# Patient Record
Sex: Male | Born: 1950
Health system: Southern US, Community
[De-identification: ages and names within clinical notes are randomized; demographics above are authoritative.]

## PROBLEM LIST (undated history)

## (undated) DIAGNOSIS — E119 Type 2 diabetes mellitus without complications: Secondary | ICD-10-CM

## (undated) DIAGNOSIS — I639 Cerebral infarction, unspecified: Secondary | ICD-10-CM

## (undated) DIAGNOSIS — E7800 Pure hypercholesterolemia, unspecified: Secondary | ICD-10-CM

## (undated) DIAGNOSIS — I10 Essential (primary) hypertension: Secondary | ICD-10-CM

---

## 2001-07-11 ENCOUNTER — Encounter: Payer: Self-pay | Admitting: Emergency Medicine

## 2001-07-11 ENCOUNTER — Inpatient Hospital Stay (HOSPITAL_COMMUNITY): Admission: EM | Admit: 2001-07-11 | Discharge: 2001-07-13 | Payer: Self-pay | Admitting: Emergency Medicine

## 2014-02-12 ENCOUNTER — Inpatient Hospital Stay (HOSPITAL_COMMUNITY): Payer: Medicare Other

## 2014-02-12 ENCOUNTER — Emergency Department (HOSPITAL_COMMUNITY): Payer: Medicare Other

## 2014-02-12 ENCOUNTER — Encounter (HOSPITAL_COMMUNITY): Payer: Self-pay | Admitting: Emergency Medicine

## 2014-02-12 ENCOUNTER — Inpatient Hospital Stay (HOSPITAL_COMMUNITY)
Admission: EM | Admit: 2014-02-12 | Discharge: 2014-02-13 | DRG: 066 | Disposition: A | Discharge status: Home / Self Care | Payer: Medicare Other | Attending: Internal Medicine | Admitting: Internal Medicine

## 2014-02-12 DIAGNOSIS — G459 Transient cerebral ischemic attack, unspecified: Secondary | ICD-10-CM | POA: Diagnosis present

## 2014-02-12 DIAGNOSIS — I10 Essential (primary) hypertension: Secondary | ICD-10-CM | POA: Diagnosis present

## 2014-02-12 DIAGNOSIS — I6350 Cerebral infarction due to unspecified occlusion or stenosis of unspecified cerebral artery: Principal | ICD-10-CM | POA: Diagnosis present

## 2014-02-12 DIAGNOSIS — R209 Unspecified disturbances of skin sensation: Secondary | ICD-10-CM | POA: Diagnosis not present

## 2014-02-12 DIAGNOSIS — Z79899 Other long term (current) drug therapy: Secondary | ICD-10-CM | POA: Diagnosis not present

## 2014-02-12 DIAGNOSIS — G458 Other transient cerebral ischemic attacks and related syndromes: Secondary | ICD-10-CM

## 2014-02-12 DIAGNOSIS — I639 Cerebral infarction, unspecified: Secondary | ICD-10-CM

## 2014-02-12 DIAGNOSIS — I6381 Other cerebral infarction due to occlusion or stenosis of small artery: Secondary | ICD-10-CM

## 2014-02-12 LAB — COMPREHENSIVE METABOLIC PANEL
ALBUMIN: 3.6 g/dL (ref 3.5–5.2)
ALT: 32 U/L (ref 0–53)
AST: 29 U/L (ref 0–37)
Alkaline Phosphatase: 94 U/L (ref 39–117)
Anion gap: 12 (ref 5–15)
BUN: 13 mg/dL (ref 6–23)
CO2: 25 mEq/L (ref 19–32)
Calcium: 9 mg/dL (ref 8.4–10.5)
Chloride: 101 mEq/L (ref 96–112)
Creatinine, Ser: 1.02 mg/dL (ref 0.50–1.35)
GFR calc non Af Amer: 75 mL/min — ABNORMAL LOW (ref >90)
GFR, EST AFRICAN AMERICAN: 87 mL/min — AB (ref >90)
GLUCOSE: 107 mg/dL — AB (ref 70–99)
POTASSIUM: 3.8 meq/L (ref 3.7–5.3)
Sodium: 138 mEq/L (ref 137–147)
TOTAL PROTEIN: 7.5 g/dL (ref 6.0–8.3)
Total Bilirubin: <0.2 mg/dL — ABNORMAL LOW (ref 0.3–1.2)

## 2014-02-12 LAB — CBC WITH DIFFERENTIAL/PLATELET
Basophils Absolute: 0 10*3/uL (ref 0.0–0.1)
Basophils Relative: 0 % (ref 0–1)
Eosinophils Absolute: 0.2 10*3/uL (ref 0.0–0.7)
Eosinophils Relative: 4 % (ref 0–5)
HCT: 40.8 % (ref 39.0–52.0)
Hemoglobin: 13.1 g/dL (ref 13.0–17.0)
Lymphocytes Relative: 37 % (ref 12–46)
Lymphs Abs: 2.2 10*3/uL (ref 0.7–4.0)
MCH: 23.2 pg — ABNORMAL LOW (ref 26.0–34.0)
MCHC: 32.1 g/dL (ref 30.0–36.0)
MCV: 72.2 fL — ABNORMAL LOW (ref 78.0–100.0)
Monocytes Absolute: 0.8 10*3/uL (ref 0.1–1.0)
Monocytes Relative: 13 % — ABNORMAL HIGH (ref 3–12)
Neutro Abs: 2.7 10*3/uL (ref 1.7–7.7)
Neutrophils Relative %: 46 % (ref 43–77)
Platelets: 179 10*3/uL (ref 150–400)
RBC: 5.65 MIL/uL (ref 4.22–5.81)
RDW: 15 % (ref 11.5–15.5)
WBC: 5.9 10*3/uL (ref 4.0–10.5)

## 2014-02-12 LAB — PROTIME-INR
INR: 1.03 (ref 0.00–1.49)
PROTHROMBIN TIME: 13.5 s (ref 11.6–15.2)

## 2014-02-12 LAB — I-STAT TROPONIN, ED: Troponin i, poc: 0 ng/mL (ref 0.00–0.08)

## 2014-02-12 LAB — APTT: APTT: 27 s (ref 24–37)

## 2014-02-12 MED ORDER — ASPIRIN 325 MG PO TABS
325.0000 mg | ORAL_TABLET | Freq: Every day | ORAL | Status: DC
  Administered 2014-02-12 – 2014-02-13 (×2): 325 mg via ORAL
  Filled 2014-02-12 (×2): qty 1

## 2014-02-12 MED ORDER — HEPARIN SODIUM (PORCINE) 5000 UNIT/ML IJ SOLN
5000.0000 [IU] | Freq: Three times a day (TID) | INTRAMUSCULAR | Status: DC
  Administered 2014-02-12 – 2014-02-13 (×4): 5000 [IU] via SUBCUTANEOUS
  Filled 2014-02-12 (×4): qty 1

## 2014-02-12 MED ORDER — ASPIRIN 300 MG RE SUPP: 300.0000 mg | Freq: Every day | RECTAL | Status: DC

## 2014-02-12 MED ORDER — STROKE: EARLY STAGES OF RECOVERY BOOK
Freq: Once | Status: DC
  Filled 2014-02-12: qty 1

## 2014-02-12 NOTE — Progress Notes (Signed)
STROKE TEAM PROGRESS NOTE   HISTORY Edward Morrow is a 63 y.o. male with a presentation of left-sided numbness that started around 1 AM. He states that he noticed that the left side of his face and his left arm both went numb, "like he had laid on it and it had gone to sleep." He states that it is getting better, and is almost resolved at this point. He does not have any medical problems, however he does not go to physicians. On EMS arrival, his blood pressure was in the 200s systolic, but after transporting him his symptoms began to resolve and his blood pressure has come down to 130s. He denies any known medical problems, he does state that his blood pressure is always higher when it is checked.    LKW: 1 AM  tpa given?: no, mild symptoms  NIH: 0    SUBJECTIVE (INTERVAL HISTORY) No family members present today. The patient still has mild left facial and left arm numbness. His strength is intact and 5 over 5 throughout. The results of his MRI were discussed with the patient and questions were answered. He admits he does not have a primary care physician. We encouraged him to become established with a physician following discharge. We discussed the importance of compliance with his medications.   OBJECTIVE Temp:  [97.6 F (36.4 C)-98.3 F (36.8 C)] 97.6 F (36.4 C) (09/20 0600) Pulse Rate:  [70-87] 70 (09/20 0600) Cardiac Rhythm:  [-]  Resp:  [10-18] 18 (09/20 0600) BP: (120-150)/(59-95) 150/81 mmHg (09/20 0600) SpO2:  [97 %-100 %] 100 % (09/20 0600) Weight:  [150 lb (68.04 kg)-162 lb 3.2 oz (73.573 kg)] 162 lb 3.2 oz (73.573 kg) (09/20 0600)  No results found for this basename: GLUCAP,  in the last 168 hours  Recent Labs Lab 02/12/14 0321  NA 138  K 3.8  CL 101  CO2 25  GLUCOSE 107*  BUN 13  CREATININE 1.02  CALCIUM 9.0    Recent Labs Lab 02/12/14 0321  AST 29  ALT 32  ALKPHOS 94  BILITOT <0.2*  PROT 7.5  ALBUMIN 3.6    Recent Labs Lab 02/12/14 0321  WBC  5.9  NEUTROABS 2.7  HGB 13.1  HCT 40.8  MCV 72.2*  PLT 179   No results found for this basename: CKTOTAL, CKMB, CKMBINDEX, TROPONINI,  in the last 168 hours  Recent Labs  02/12/14 0321  LABPROT 13.5  INR 1.03   No results found for this basename: COLORURINE, APPERANCEUR, LABSPEC, PHURINE, GLUCOSEU, HGBUR, BILIRUBINUR, KETONESUR, PROTEINUR, UROBILINOGEN, NITRITE, LEUKOCYTESUR,  in the last 72 hours  No results found for this basename: chol,  trig,  hdl,  cholhdl,  vldl,  ldlcalc   No results found for this basename: HGBA1C   No results found for this basename: labopia,  cocainscrnur,  labbenz,  amphetmu,  thcu,  labbarb    No results found for this basename: ETH,  in the last 168 hours  Ct Head Wo Contrast 02/12/2014    Unremarkable noncontrast CT of the head.     MRI / MRA 02/12/2014 Sub cm acute infarction affecting the lateral right thalamus.  Otherwise normal appearance of the brain. No sign of hemorrhage or mass effect.  Normal intracranial MR angiography of the large and medium size vessels.    PHYSICAL EXAM  Gen: NAD, conversant  CV: RRR, No Murmurs ABD: Soft, NT  Mental Status:  Patient is awake, alert, oriented to person, place, month, year, and situation.  Immediate and remote memory are intact.  Patient is able to give a clear and coherent history.  No signs of aphasia or neglect  Cranial Nerves:  II: Visual Fields are full. Pupils are equal, round, and reactive to light. Discs are difficult to visualize.  III,IV, VI: EOMI without ptosis or diploplia.  V: Facial sensation is symmetric to temperature  VII: Left nasolabial flattening.  VIII: hearing is intact to voice  X: Uvula elevates symmetrically  XI: Shoulder shrug is symmetric.  XII: tongue is midline without atrophy or fasciculations.  Motor:  Tone is normal. Bulk is normal. 5/5 strength was present in all four extremities. However mild left arm drift. Sensory:  Decreased sensation to light  touch in the left upper extremity Deep Tendon Reflexes:  2+ and symmetric in the biceps and patellae.  Plantars:  Toes are downgoing bilaterally.  Cerebellar:  FNF and HKS are intact bilaterally     ASSESSMENT/PLAN  Edward Morrow is a 63 y.o. male with no significant past medical history  presenting with numbness of his left arm and face.Marland Kitchen He did not receive IV t-PA as his symptoms were mild. CT of the head was unremarkable.    MRI -Sub cm acute infarction affecting the lateral right thalamus secondary to small vessel disease.  no antithrombotics prior to admission, now on aspirin 325 mg orally every day  MRI as above  MRA  Normal intracranial MR angiography of the large and medium size vessels.  Carotid Doppler  pending  2D Echo  pending   LDL pending  HgbA1c pending  Ongoing aggressive risk factor management  Risk factor education  Patient counseled to be compliant with his antithrombotic medications and follow up with primary care (despite not liking doctors)  Hypertension - no previous history of hypertension however BP was initially elevated.  Home meds: - No antihypertensive medications prior to admission  BP (120-150)/(59-95) 150/81 mmHg (09/20 0600) past 24h (02/12/2014 @ 3:40 PM)  SBP goal normotensive  Stable   Hyperlipidemia - no previous history of hyperlipidemia.  Home meds: - No lipid lowering medications prior to admission.  LDL pending   LDL goal < 100 (<70 for diabetics)  Diabetes - no previous history of diabetes  Home meds: - No diabetic medications prior to admission  HgbA1c - pending  Goal < 7.0  Educated patient about lifestyle changes for diabetes prevention  Other Stroke Risk Factors Advanced age  Hospital day # 0  Personally examined patient and images, agree with history, physical, neuro exam as stated above. Agree with assessment and plan.   Naomie Dean, MD [facility]  Delton See  PA-C [facility] Pager [phone] 02/12/2014, 3:40 PM     To contact Stroke Continuity provider, please refer to [email or web address]. After hours, contact General Neurology

## 2014-02-12 NOTE — ED Notes (Signed)
Patient transported to CT 

## 2014-02-12 NOTE — ED Notes (Signed)
Dr. Petra Kuba, neurologist, at bedside assessing patient

## 2014-02-12 NOTE — Consult Note (Signed)
Neurology Consultation Reason for Consult: Left-sided numbness Referring Physician: Judd Lien, D  CC: Left-sided numbness  History is obtained from: Patient  HPI: Edward Morrow is a 63 y.o. male with a presentation of left-sided numbness that started around 1 AM. He states that he noticed that the left side of his face and his left arm both went numb, "like he had laid on it and it had gone to sleep." He states that it is getting better, and is almost resolved at this point.  He does not have any medical problems, however he does not go to physicians. On EMS arrival, his blood pressure was in the 200s systolic, but after transporting him after his symptoms began to resolve his blood pressure has come down to 130s.  That he denies any known medical problems, he does state that his blood pressure is always higher when it is checked.  LKW: 1 AM tpa given?: no, mild symptoms NIH: 0   ROS: A 14 point ROS was performed and is negative except as noted in the HPI.   Past medical history:  Hypertension  Family History: Stroke  Social History: Tob: Chews  Exam: Current vital signs: BP 148/78  Pulse 82  Temp(Src) 97.9 F (36.6 C) (Oral)  Resp 15  Ht  (1.651 m)  Wt 68.04 kg (150 lb)  BMI 24.96 kg/m2  SpO2 98% Vital signs in last 24 hours: Temp:  [97.9 F (36.6 C)-98.3 F (36.8 C)] 97.9 F (36.6 C) (09/20 0330) Pulse Rate:  [82-87] 82 (09/20 0322) Resp:  [10-18] 15 (09/20 0322) BP: (144-148)/(78-89) 148/78 mmHg (09/20 0322) SpO2:  [98 %-99 %] 98 % (09/20 0322) Weight:  [68.04 kg (150 lb)] 68.04 kg (150 lb) (09/20 0237)  General: In bed, NAD CV: Regular rate and rhythm Mental Status: Patient is awake, alert, oriented to person, place, month, year, and situation. Immediate and remote memory are intact. Patient is able to give a clear and coherent history. No signs of aphasia or neglect Cranial Nerves: II: Visual Fields are full. Pupils are equal, round, and reactive to  light.  Discs are difficult to visualize. III,IV, VI: EOMI without ptosis or diploplia.  V: Facial sensation is symmetric to temperature VII: Facial movement is symmetric.  VIII: hearing is intact to voice X: Uvula elevates symmetrically XI: Shoulder shrug is symmetric. XII: tongue is midline without atrophy or fasciculations.  Motor: Tone is normal. Bulk is normal. 5/5 strength was present in all four extremities.  Sensory: Sensation is symmetric to pinprick. Deep Tendon Reflexes: 2+ and symmetric in the biceps and patellae.  Plantars: Toes are downgoing bilaterally.  Cerebellar: FNF and HKS are intact bilaterally Gait: Not assessed due to  multiple medical monitors in ED setting.   I have reviewed labs in epic and the results pertinent to this consultation are: CMP-unremarkable  I have reviewed the images obtained: CT head-unremarkable  Impression: 63 year old male with a history of hypertension who presents with left arm and face numbness that is rapidly improving. I suspect this represents TIA, especially given that his blood pressures initially were in the 200s systolic and have come down to such a degree. He will need to have a workup for TIA.  Recommendations: 1. HgbA1c, fasting lipid panel 2. MRI, MRA  of the brain without contrast 3. Frequent neuro checks 4. Echocardiogram 5. Carotid dopplers 6. Prophylactic therapy-Antiplatelet med: Aspirin - dose  PO or  PR 7. Risk factor modification 8. Telemetry monitoring  Ritta Slot, MD [facility] [phone]  If 7pm- 7am, please page neurology on call as listed in [place].

## 2014-02-12 NOTE — H&P (Signed)
[facility] History and Physical  Edward Morrow ZOX:[id number] DOB: 1950-12-21 DOA: 02/12/2014  Referring physician: EDP PCP: Pearla Dubonnet, MD   Chief Complaint: L sided numbness   HPI: Edward Morrow is a 63 y.o. male who presents to the ED with L sided numbness that onset around 1 AM.  Noticed sudden onset of numbness to his left arm and the L side of his face like they had gone to sleep.  It is nearly resolved at the time of admission and has been improving since onset.  He has no chronic medical problems but also "dosent go to a doctor".  On EMS arrival to his house his BP was documented to be 210/110 which improved to 170/90 PTA at the ED, since arrival in the ED all subsequent BPs have been much better running in the 120s-140s systolic.  Review of Systems: Systems reviewed.  As above, otherwise negative  History reviewed. No pertinent past medical history. History reviewed. No pertinent past surgical history. Social History:  reports that he has never smoked. He does not have any smokeless tobacco history on file. He reports that he does not drink alcohol or use illicit drugs.  No Known Allergies  No family history on file.   Prior to Admission medications   Not on File   Physical Exam: Filed Vitals:   02/12/14 0445  BP: 121/90  Pulse: 71  Temp:   Resp: 15    BP 121/90  Pulse 71  Temp(Src) 97.9 F (36.6 C) (Oral)  Resp 15  Ht  (1.651 m)  Wt 68.04 kg (150 lb)  BMI 24.96 kg/m2  SpO2 97%  General Appearance:    Alert, oriented, no distress, appears stated age  Head:    Normocephalic, atraumatic  Eyes:    PERRL, EOMI, sclera non-icteric        Nose:   Nares without drainage or epistaxis. Mucosa, turbinates normal  Throat:   Moist mucous membranes. Oropharynx without erythema or exudate.  Neck:   Supple. No carotid bruits.  No thyromegaly.  No lymphadenopathy.   Back:     No CVA tenderness, no spinal tenderness  Lungs:     Clear to  auscultation bilaterally, without wheezes, rhonchi or rales  Chest wall:    No tenderness to palpitation  Heart:    Regular rate and rhythm without murmurs, gallops, rubs  Abdomen:     Soft, non-tender, nondistended, normal bowel sounds, no organomegaly  Genitalia:    deferred  Rectal:    deferred  Extremities:   No clubbing, cyanosis or edema.  Pulses:   2+ and symmetric all extremities  Skin:   Skin color, texture, turgor normal, no rashes or lesions  Lymph nodes:   Cervical, supraclavicular, and axillary nodes normal  Neurologic:   CNII-XII intact. Normal strength, sensation and reflexes      throughout    Labs on Admission:  Basic Metabolic Panel:  Recent Labs Lab 02/12/14 0321  NA 138  K 3.8  CL 101  CO2 25  GLUCOSE 107*  BUN 13  CREATININE 1.02  CALCIUM 9.0   Liver Function Tests:  Recent Labs Lab 02/12/14 0321  AST 29  ALT 32  ALKPHOS 94  BILITOT <0.2*  PROT 7.5  ALBUMIN 3.6   No results found for this basename: LIPASE, AMYLASE,  in the last 168 hours No results found for this basename: AMMONIA,  in the last 168 hours CBC:  Recent Labs Lab 02/12/14 0321  WBC  5.9  NEUTROABS 2.7  HGB 13.1  HCT 40.8  MCV 72.2*  PLT 179   Cardiac Enzymes: No results found for this basename: CKTOTAL, CKMB, CKMBINDEX, TROPONINI,  in the last 168 hours  BNP (last 3 results) No results found for this basename: PROBNP,  in the last 8760 hours CBG: No results found for this basename: GLUCAP,  in the last 168 hours  Radiological Exams on Admission: Ct Head Wo Contrast  02/12/2014   CLINICAL DATA:  Numbness and tingling in the left forearm. Hypertension.  EXAM: CT HEAD WITHOUT CONTRAST  TECHNIQUE: Contiguous axial images were obtained from the base of the skull through the vertex without intravenous contrast.  COMPARISON:  None.  FINDINGS: There is no evidence of acute infarction, mass lesion, or intra- or extra-axial hemorrhage on CT.  The posterior fossa, including the  cerebellum, brainstem and fourth ventricle, is within normal limits. The third and lateral ventricles, and basal ganglia are unremarkable in appearance. The cerebral hemispheres are symmetric in appearance, with normal gray-white differentiation. No mass effect or midline shift is seen.  There is no evidence of fracture; visualized osseous structures are unremarkable in appearance. The orbits are within normal limits. The paranasal sinuses and mastoid air cells are well-aerated. No significant soft tissue abnormalities are seen.  IMPRESSION: Unremarkable noncontrast CT of the head.   Electronically Signed   By: Roanna Raider M.D.   On: 02/12/2014 04:03    EKG: Independently reviewed.  Assessment/Plan Principal Problem:   TIA (transient ischemic attack)   1. TIA - given the unilateral symptoms in the setting of elevated BPs, suspicious that this represents a TIA. 1. Neuro on board, see Dr. Alene Mires note 2. ASA 325 3. Stroke work up ordered and pending including MRI/MRA, carotid dopplers, 2d ultrasound, lipid panel and A1C. 4. Tele monitor    Code Status: Full  Family Communication: Wife at bedside Disposition Plan: Admit to inpatient   Time spent: 70 min  Coreen Shippee M. [facility] Pager [phone]  If 7AM-7PM, please contact the day team taking care of the patient [email or web address] Password [email or web address] 02/12/2014, 5:18 AM

## 2014-02-12 NOTE — ED Notes (Signed)
here by EMS from home, here for numbness tingling in L FA. Better now than earlier, abating. (denies: pain, sob, nvd, fever or other sx). BP for EMS was 210/110, HR 118.  VS rechecked prior to arrival and was 170/90, HR 90. Denies PMHx, but also "do not go to the doctor".

## 2014-02-12 NOTE — ED Notes (Signed)
Dr Kirkpatrick in to see patient  

## 2014-02-12 NOTE — Progress Notes (Signed)
0540: patient transferred to 4N03, alert and oriented. Oriented to room and equipment. Cardiac monitoring initiated. Initial assessment performed. NAD noted. Will continue to monitor.

## 2014-02-12 NOTE — Progress Notes (Signed)
[facility] PROGRESS NOTE  MCCRAE SPECIALE CWC:[id number] DOB: 06/28/1950 DOA: 02/12/2014 PCP: Pearla Dubonnet, MD  Assessment/Plan: Acute ischemic stroke Left sided weakness resolved, still has some LUE Numbness. MRi brain shows sub cm acute lateral right thalamus infarct. Rik factors included uncontrolled HTN and ? HL -full dose aspirin. Pt reports not seeing his PCP over 3 years. continue neuro checks  stroke team following  allow permissive BP PT eval -pending full stroke w/up Including 2D echo, carotid doppler, lipid panel and A1C   uncontrolled HTN Allow permissive HTN  DVT prophylaxis Sq heparin  Diet: heart healthy  Code Status: FULL CODE Family Communication: none at bedside Disposition Plan: home after stroke w/up completed , possibly on 9/21   Consultants:  neurology  Procedures:  MRI brain  Antibiotics:  none  HPI/Subjective: Denies any weakness, still has some left sided numbness  Objective: Filed Vitals:   02/12/14 0600  BP: 150/81  Pulse: 70  Temp: 97.6 F (36.4 C)  Resp: 18   No intake or output data in the 24 hours ending 02/12/14 1042 Filed Weights   02/12/14 0237 02/12/14 0600  Weight: 68.04 kg (150 lb) 73.573 kg (162 lb 3.2 oz)    Exam:   General:  NAD  HEENT: no pallor, moist mucosa  Cardiovascular: NS1&S2, 2/6 systolic murmur  Respiratory: clear b/l  Abdomen: soft, mild distention, NT, BS+  Musculoskeletal: warm, no edema  CNS: AAOX3, cranial Ns intact, normal motor tone , power and reflex  Data Reviewed: Basic Metabolic Panel:  Recent Labs Lab 02/12/14 0321  NA 138  K 3.8  CL 101  CO2 25  GLUCOSE 107*  BUN 13  CREATININE 1.02  CALCIUM 9.0   Liver Function Tests:  Recent Labs Lab 02/12/14 0321  AST 29  ALT 32  ALKPHOS 94  BILITOT <0.2*  PROT 7.5  ALBUMIN 3.6   No results found for this basename: LIPASE, AMYLASE,  in the last 168 hours No results found for this basename: AMMONIA,  in  the last 168 hours CBC:  Recent Labs Lab 02/12/14 0321  WBC 5.9  NEUTROABS 2.7  HGB 13.1  HCT 40.8  MCV 72.2*  PLT 179   Cardiac Enzymes: No results found for this basename: CKTOTAL, CKMB, CKMBINDEX, TROPONINI,  in the last 168 hours BNP (last 3 results) No results found for this basename: PROBNP,  in the last 8760 hours CBG: No results found for this basename: GLUCAP,  in the last 168 hours  No results found for this or any previous visit (from the past 240 hour(s)).   Studies: Ct Head Wo Contrast  02/12/2014   CLINICAL DATA:  Numbness and tingling in the left forearm. Hypertension.  EXAM: CT HEAD WITHOUT CONTRAST  TECHNIQUE: Contiguous axial images were obtained from the base of the skull through the vertex without intravenous contrast.  COMPARISON:  None.  FINDINGS: There is no evidence of acute infarction, mass lesion, or intra- or extra-axial hemorrhage on CT.  The posterior fossa, including the cerebellum, brainstem and fourth ventricle, is within normal limits. The third and lateral ventricles, and basal ganglia are unremarkable in appearance. The cerebral hemispheres are symmetric in appearance, with normal gray-white differentiation. No mass effect or midline shift is seen.  There is no evidence of fracture; visualized osseous structures are unremarkable in appearance. The orbits are within normal limits. The paranasal sinuses and mastoid air cells are well-aerated. No significant soft tissue abnormalities are seen.  IMPRESSION: Unremarkable noncontrast CT of the  head.   Electronically Signed   By: Roanna Raider M.D.   On: 02/12/2014 04:03   Mr Brain Wo Contrast  02/12/2014   CLINICAL DATA:  Abnormal sensation of the left face and left hand. Left-sided numbness.  EXAM: MRI HEAD WITHOUT CONTRAST  MRA HEAD WITHOUT CONTRAST  TECHNIQUE: Multiplanar, multiecho pulse sequences of the brain and surrounding structures were obtained without intravenous contrast. Angiographic images of the  head were obtained using MRA technique without contrast.  COMPARISON:  Head CT same day  FINDINGS: MRI HEAD FINDINGS  Diffusion imaging shows sub cm acute infarction within the lateral right thalamus. No other acute infarction. The brainstem and cerebellum are normal. The cerebral hemispheres are otherwise normal. No old infarction. No mass lesion, hemorrhage, hydrocephalus or extra-axial collection. No pituitary mass. No inflammatory sinus disease. No skull or skullbase lesion.  MRA HEAD FINDINGS  Both internal carotid arteries are widely patent into the brain. No siphon stenosis. The anterior and middle cerebral vessels are normal without proximal stenosis, aneurysm or vascular malformation. Both vertebral arteries are widely patent to the basilar. No basilar stenosis. Posterior circulation branch vessels are patent.  IMPRESSION: Sub cm acute infarction affecting the lateral right thalamus. Otherwise normal appearance of the brain. No sign of hemorrhage or mass effect.  Normal intracranial MR angiography of the large and medium size vessels.   Electronically Signed   By: Paulina Fusi M.D.   On: 02/12/2014 10:28   Mr Maxine Glenn Head/brain Wo Cm  02/12/2014   CLINICAL DATA:  Abnormal sensation of the left face and left hand. Left-sided numbness.  EXAM: MRI HEAD WITHOUT CONTRAST  MRA HEAD WITHOUT CONTRAST  TECHNIQUE: Multiplanar, multiecho pulse sequences of the brain and surrounding structures were obtained without intravenous contrast. Angiographic images of the head were obtained using MRA technique without contrast.  COMPARISON:  Head CT same day  FINDINGS: MRI HEAD FINDINGS  Diffusion imaging shows sub cm acute infarction within the lateral right thalamus. No other acute infarction. The brainstem and cerebellum are normal. The cerebral hemispheres are otherwise normal. No old infarction. No mass lesion, hemorrhage, hydrocephalus or extra-axial collection. No pituitary mass. No inflammatory sinus disease. No skull or  skullbase lesion.  MRA HEAD FINDINGS  Both internal carotid arteries are widely patent into the brain. No siphon stenosis. The anterior and middle cerebral vessels are normal without proximal stenosis, aneurysm or vascular malformation. Both vertebral arteries are widely patent to the basilar. No basilar stenosis. Posterior circulation branch vessels are patent.  IMPRESSION: Sub cm acute infarction affecting the lateral right thalamus. Otherwise normal appearance of the brain. No sign of hemorrhage or mass effect.  Normal intracranial MR angiography of the large and medium size vessels.   Electronically Signed   By: Paulina Fusi M.D.   On: 02/12/2014 10:28    Scheduled Meds: .  stroke: mapping our early stages of recovery book   Does not apply Once  . aspirin  300 mg Rectal Daily   Or  . aspirin  325 mg Oral Daily  . heparin  5,000 Units Subcutaneous 3 times per day   Continuous Infusions:     Time spent: 20 minutes    Eddie North  [facility] Pager  [phone] If 7PM-7AM, please contact night-coverage at [email or web address], password [email or web address] 02/12/2014, 10:42 AM  LOS: 0 days

## 2014-02-12 NOTE — ED Provider Notes (Signed)
CSN: [id number]     Arrival date & time 02/12/14  0236 History   First MD Initiated Contact with Patient 02/12/14 0251     Chief Complaint  Patient presents with  . Numbness     (Consider location/radiation/quality/duration/timing/severity/associated sxs/prior Treatment) HPI Comments: Patient is a 63 year old male with no significant PMH who presents with complaints of left arm, left leg, and facial numbness since approximately 1AM.  He was awake at the time.  He denies headache, fever, chills.  Symptoms are improving now.    Patient is a 63 y.o. male presenting with weakness. The history is provided by the patient.  Weakness This is a new problem. Episode onset: 1 AM. The problem occurs constantly. The problem has been gradually improving. Pertinent negatives include no chest pain, no headaches and no shortness of breath. Nothing aggravates the symptoms. Nothing relieves the symptoms. He has tried nothing for the symptoms. The treatment provided no relief.    History reviewed. No pertinent past medical history. History reviewed. No pertinent past surgical history. No family history on file. History  Substance Use Topics  . Smoking status: Never Smoker   . Smokeless tobacco: Not on file  . Alcohol Use: No    Review of Systems  Respiratory: Negative for shortness of breath.   Cardiovascular: Negative for chest pain.  Neurological: Positive for weakness. Negative for headaches.  All other systems reviewed and are negative.     Allergies  Review of patient's allergies indicates no known allergies.  Home Medications   Prior to Admission medications   Not on File   BP 148/78  Pulse 82  Temp(Src) 98.2 F (36.8 C) (Oral)  Resp 15  Ht  (1.651 m)  Wt 150 lb (68.04 kg)  BMI 24.96 kg/m2  SpO2 98% Physical Exam  Nursing note and vitals reviewed. Constitutional: He is oriented to person, place, and time. He appears well-developed and well-nourished. No distress.  HENT:   Head: Normocephalic and atraumatic.  Mouth/Throat: Oropharynx is clear and moist.  Eyes: EOM are normal. Pupils are equal, round, and reactive to light.  Neck: Normal range of motion. Neck supple.  Cardiovascular: Normal rate, regular rhythm and normal heart sounds.   No murmur heard. Pulmonary/Chest: Effort normal and breath sounds normal. No respiratory distress. He has no wheezes.  Abdominal: Soft. Bowel sounds are normal. He exhibits no distension. There is no tenderness.  Musculoskeletal: Normal range of motion. He exhibits no edema.  Lymphadenopathy:    He has no cervical adenopathy.  Neurological: He is alert and oriented to person, place, and time. No cranial nerve deficit. He exhibits normal muscle tone. Coordination normal.  Skin: Skin is warm and dry. He is not diaphoretic.    ED Course  Procedures (including critical care time) Labs Review Labs Reviewed  CBC WITH DIFFERENTIAL  COMPREHENSIVE METABOLIC PANEL  PROTIME-INR  APTT  I-STAT TROPOININ, ED    Imaging Review No results found.   Date: 02/12/2014  Rate: 86  Rhythm: normal sinus rhythm  QRS Axis: left  Intervals: normal  ST/T Wave abnormalities: normal  Conduction Disutrbances:none  Narrative Interpretation:   Old EKG Reviewed: none available    MDM   Final diagnoses:  None     Patient presents with complaints of numbness to his left face left arm and left leg that started approximately 1 AM. He was initially hypertensive with EMS, however his blood pressure normalized as his symptoms resolved. He underwent head CT and laboratory studies  here, all of which were unremarkable. He was evaluated by Dr. Amada Jupiter who feels as though the patient likely experienced a TIA and should be admitted for further workup. I've spoken with Dr. Julian Reil who agrees to admit.    Geoffery Lyons, MD 02/12/14 [phone]

## 2014-02-13 DIAGNOSIS — I10 Essential (primary) hypertension: Secondary | ICD-10-CM

## 2014-02-13 DIAGNOSIS — I517 Cardiomegaly: Secondary | ICD-10-CM

## 2014-02-13 LAB — LIPID PANEL
Cholesterol: 205 mg/dL — ABNORMAL HIGH (ref 0–200)
HDL: 62 mg/dL (ref >39)
LDL CALC: 108 mg/dL — AB (ref 0–99)
Total CHOL/HDL Ratio: 3.3 RATIO
Triglycerides: 177 mg/dL — ABNORMAL HIGH (ref <150)
VLDL: 35 mg/dL (ref 0–40)

## 2014-02-13 LAB — HEMOGLOBIN A1C
Hgb A1c MFr Bld: 6.8 % — ABNORMAL HIGH (ref <5.7)
Mean Plasma Glucose: 148 mg/dL — ABNORMAL HIGH (ref <117)

## 2014-02-13 MED ORDER — AMLODIPINE BESYLATE 2.5 MG PO TABS
2.5000 mg | ORAL_TABLET | Freq: Every day | ORAL | Status: DC
Start: 2014-02-13 — End: 2014-06-30

## 2014-02-13 MED ORDER — SIMVASTATIN 20 MG PO TABS: 20.0000 mg | ORAL_TABLET | Freq: Every day | ORAL | Status: DC

## 2014-02-13 MED ORDER — AMLODIPINE BESYLATE 5 MG PO TABS: 5.0000 mg | ORAL_TABLET | Freq: Every day | ORAL | Status: DC

## 2014-02-13 MED ORDER — AMLODIPINE BESYLATE 2.5 MG PO TABS: 2.5000 mg | ORAL_TABLET | Freq: Every day | ORAL | Status: DC

## 2014-02-13 MED ORDER — ASPIRIN 325 MG PO TABS: 325.0000 mg | ORAL_TABLET | Freq: Every day | ORAL | Status: AC

## 2014-02-13 NOTE — Progress Notes (Signed)
*  PRELIMINARY RESULTS* Echocardiogram 2D Echocardiogram has been performed.  Edward Morrow 02/13/2014, 9:22 AM

## 2014-02-13 NOTE — Discharge Summary (Signed)
Physician Discharge Summary  Edward Morrow ZOX:[id number] DOB: 1951-01-29 DOA: 02/12/2014  PCP: Pearla Dubonnet, MD  Admit date: 02/12/2014 Discharge date: 02/13/2014  Time spent: 35 minutes  Recommendations for Outpatient Follow-up:  1. Need further adjustment of BP medications.  2. Need referral to cardiology due to hypokanesis seen on ECHO.  3. Need fasting blood sugar and Hb A1c. HBA1 was pending prior to discharge.   Discharge Diagnoses:    Thalamic infarct, right   Uncontrolled hypertension   Discharge Condition: Stable.   Diet recommendation: Heart Healthy  Filed Weights   02/12/14 0237 02/12/14 0600  Weight: 68.04 kg (150 lb) 73.573 kg (162 lb 3.2 oz)    History of present illness:  Edward Morrow is a 63 y.o. male who presents to the ED with L sided numbness that onset around 1 AM. Noticed sudden onset of numbness to his left arm and the L side of his face like they had gone to sleep. It is nearly resolved at the time of admission and has been improving since onset. He has no chronic medical problems but also "dosent go to a doctor".  On EMS arrival to his house his BP was documented to be 210/110 which improved to 170/90 PTA at the ED, since arrival in the ED all subsequent BPs have been much better running in the 120s-140s systolic   Hospital Course:  Acute ischemic stroke  Left sided weakness resolved, still has some LUE Numbness. MRi brain shows sub cm acute lateral right thalamus infarct. Rik factors included uncontrolled HTN and ? HL  -full dose aspirin. Pt reports not seeing his PCP over 3 years.   -2D echo no source of embolism.  -carotid doppler 1-395 ICA stenosis. Vertebral artery flow is antegrade -LDL 108, started on statins.  -A1C pending   uncontrolled HTN  Will start low dose Norvasc.   DVT prophylaxis Sq heparin   Procedures:    Consultations:  Neurology  Discharge Exam: Filed Vitals:   02/13/14 1034  BP: 146/78  Pulse: 76   Temp: 98.2 F (36.8 C)  Resp: 18    General: No distress.  Cardiovascular: S 1, S 2 RRR Respiratory: CTA Neuro; left side numbness.   Discharge Instructions You were cared for by a hospitalist during your hospital stay. If you have any questions about your discharge medications or the care you received while you were in the hospital after you are discharged, you can call the unit and asked to speak with the hospitalist on call if the hospitalist that took care of you is not available. Once you are discharged, your primary care physician will handle any further medical issues. Please note that NO REFILLS for any discharge medications will be authorized once you are discharged, as it is imperative that you return to your primary care physician (or establish a relationship with a primary care physician if you do not have one) for your aftercare needs so that they can reassess your need for medications and monitor your lab values.  Discharge Instructions   Diet - low sodium heart healthy    Complete by:  As directed      Increase activity slowly    Complete by:  As directed           Current Discharge Medication List    START taking these medications   Details  amLODipine (NORVASC) 2.5 MG tablet Take 1 tablet (2.5 mg total) by mouth daily. Qty: 30 tablet, Refills: 0  aspirin 325 MG tablet Take 1 tablet (325 mg total) by mouth daily. Qty: 30 tablet, Refills: 0    simvastatin (ZOCOR) 20 MG tablet Take 1 tablet (20 mg total) by mouth daily at 6 PM. Qty: 30 tablet, Refills: 0       No Known Allergies Follow-up Information   Follow up with Xu,Jindong, MD. Schedule an appointment as soon as possible for a visit in 2 months. ([facility], Dr. Roda Shutters is a stroke MD and Dr. Marlis Edelson partner)    Specialty:  Neurology   Contact information:   [address] [place] [address] [phone]       Follow up with Pearla Dubonnet, MD.   Specialty:  Internal Medicine    Contact information:   [address] [place] [address] [phone]        The results of significant diagnostics from this hospitalization (including imaging, microbiology, ancillary and laboratory) are listed below for reference.    Significant Diagnostic Studies: Ct Head Wo Contrast  02/12/2014   CLINICAL DATA:  Numbness and tingling in the left forearm. Hypertension.  EXAM: CT HEAD WITHOUT CONTRAST  TECHNIQUE: Contiguous axial images were obtained from the base of the skull through the vertex without intravenous contrast.  COMPARISON:  None.  FINDINGS: There is no evidence of acute infarction, mass lesion, or intra- or extra-axial hemorrhage on CT.  The posterior fossa, including the cerebellum, brainstem and fourth ventricle, is within normal limits. The third and lateral ventricles, and basal ganglia are unremarkable in appearance. The cerebral hemispheres are symmetric in appearance, with normal gray-white differentiation. No mass effect or midline shift is seen.  There is no evidence of fracture; visualized osseous structures are unremarkable in appearance. The orbits are within normal limits. The paranasal sinuses and mastoid air cells are well-aerated. No significant soft tissue abnormalities are seen.  IMPRESSION: Unremarkable noncontrast CT of the head.   Electronically Signed   By: Roanna Raider M.D.   On: 02/12/2014 04:03   Mr Brain Wo Contrast  02/12/2014   CLINICAL DATA:  Abnormal sensation of the left face and left hand. Left-sided numbness.  EXAM: MRI HEAD WITHOUT CONTRAST  MRA HEAD WITHOUT CONTRAST  TECHNIQUE: Multiplanar, multiecho pulse sequences of the brain and surrounding structures were obtained without intravenous contrast. Angiographic images of the head were obtained using MRA technique without contrast.  COMPARISON:  Head CT same day  FINDINGS: MRI HEAD FINDINGS  Diffusion imaging shows sub cm acute infarction within the lateral right thalamus. No  other acute infarction. The brainstem and cerebellum are normal. The cerebral hemispheres are otherwise normal. No old infarction. No mass lesion, hemorrhage, hydrocephalus or extra-axial collection. No pituitary mass. No inflammatory sinus disease. No skull or skullbase lesion.  MRA HEAD FINDINGS  Both internal carotid arteries are widely patent into the brain. No siphon stenosis. The anterior and middle cerebral vessels are normal without proximal stenosis, aneurysm or vascular malformation. Both vertebral arteries are widely patent to the basilar. No basilar stenosis. Posterior circulation branch vessels are patent.  IMPRESSION: Sub cm acute infarction affecting the lateral right thalamus. Otherwise normal appearance of the brain. No sign of hemorrhage or mass effect.  Normal intracranial MR angiography of the large and medium size vessels.   Electronically Signed   By: Paulina Fusi M.D.   On: 02/12/2014 10:28   Mr Maxine Glenn Head/brain Wo Cm  02/12/2014   CLINICAL DATA:  Abnormal sensation of the left face and left  hand. Left-sided numbness.  EXAM: MRI HEAD WITHOUT CONTRAST  MRA HEAD WITHOUT CONTRAST  TECHNIQUE: Multiplanar, multiecho pulse sequences of the brain and surrounding structures were obtained without intravenous contrast. Angiographic images of the head were obtained using MRA technique without contrast.  COMPARISON:  Head CT same day  FINDINGS: MRI HEAD FINDINGS  Diffusion imaging shows sub cm acute infarction within the lateral right thalamus. No other acute infarction. The brainstem and cerebellum are normal. The cerebral hemispheres are otherwise normal. No old infarction. No mass lesion, hemorrhage, hydrocephalus or extra-axial collection. No pituitary mass. No inflammatory sinus disease. No skull or skullbase lesion.  MRA HEAD FINDINGS  Both internal carotid arteries are widely patent into the brain. No siphon stenosis. The anterior and middle cerebral vessels are normal without proximal stenosis,  aneurysm or vascular malformation. Both vertebral arteries are widely patent to the basilar. No basilar stenosis. Posterior circulation branch vessels are patent.  IMPRESSION: Sub cm acute infarction affecting the lateral right thalamus. Otherwise normal appearance of the brain. No sign of hemorrhage or mass effect.  Normal intracranial MR angiography of the large and medium size vessels.   Electronically Signed   By: Paulina Fusi M.D.   On: 02/12/2014 10:28    Microbiology: No results found for this or any previous visit (from the past 240 hour(s)).   Labs: Basic Metabolic Panel:  Recent Labs Lab 02/12/14 0321  NA 138  K 3.8  CL 101  CO2 25  GLUCOSE 107*  BUN 13  CREATININE 1.02  CALCIUM 9.0   Liver Function Tests:  Recent Labs Lab 02/12/14 0321  AST 29  ALT 32  ALKPHOS 94  BILITOT <0.2*  PROT 7.5  ALBUMIN 3.6   No results found for this basename: LIPASE, AMYLASE,  in the last 168 hours No results found for this basename: AMMONIA,  in the last 168 hours CBC:  Recent Labs Lab 02/12/14 0321  WBC 5.9  NEUTROABS 2.7  HGB 13.1  HCT 40.8  MCV 72.2*  PLT 179   Cardiac Enzymes: No results found for this basename: CKTOTAL, CKMB, CKMBINDEX, TROPONINI,  in the last 168 hours BNP: BNP (last 3 results) No results found for this basename: PROBNP,  in the last 8760 hours CBG: No results found for this basename: GLUCAP,  in the last 168 hours     Signed:  Hartley Barefoot A  [facility] 02/13/2014, 1:00 PM

## 2014-02-13 NOTE — Progress Notes (Signed)
STROKE TEAM PROGRESS NOTE   HISTORY Edward Morrow is a 63 y.o. male with a presentation of left-sided numbness that started around 1 AM on  02/12/2014. He states that he noticed that the left side of his face and his left arm both went numb, "like he had laid on it and it had gone to sleep." He states that it is getting better, and is almost resolved at this point. He does not have any medical problems, however he does not go to physicians. On EMS arrival, his blood pressure was in the 200s systolic, but after transporting him his symptoms began to resolve and his blood pressure has come down to 130s. He denies any known medical problems, he does state that his blood pressure is always higher when it is checked.  tpa given?: no, mild symptoms. NIH: 0   SUBJECTIVE (INTERVAL HISTORY) Patient sitting on edge of bed. No family/friends present. He still reports left sided alteration in sensation.  OBJECTIVE Temp:  [97.3 F (36.3 C)-98.6 F (37 C)] 98.2 F (36.8 C) (09/21 1034) Pulse Rate:  [74-98] 76 (09/21 1034) Cardiac Rhythm:  [-] Normal sinus rhythm (09/20 2000) Resp:  [15-20] 18 (09/21 1034) BP: (131-146)/(78-93) 146/78 mmHg (09/21 1034) SpO2:  [98 %-100 %] 98 % (09/21 1034)   Recent Labs Lab 02/12/14 0321  NA 138  K 3.8  CL 101  CO2 25  GLUCOSE 107*  BUN 13  CREATININE 1.02  CALCIUM 9.0    Recent Labs Lab 02/12/14 0321  AST 29  ALT 32  ALKPHOS 94  BILITOT <0.2*  PROT 7.5  ALBUMIN 3.6    Recent Labs Lab 02/12/14 0321  WBC 5.9  NEUTROABS 2.7  HGB 13.1  HCT 40.8  MCV 72.2*  PLT 179   No results found for this basename: CKTOTAL, CKMB, CKMBINDEX, TROPONINI,  in the last 168 hours  Recent Labs  02/12/14 0321  LABPROT 13.5  INR 1.03   No results found for this basename: COLORURINE, APPERANCEUR, LABSPEC, PHURINE, GLUCOSEU, HGBUR, BILIRUBINUR, KETONESUR, PROTEINUR, UROBILINOGEN, NITRITE, LEUKOCYTESUR,  in the last 72 hours  Lipid Panel     Component Value  Date/Time   CHOL 205* 02/13/2014 0610   TRIG 177* 02/13/2014 0610   HDL 62 02/13/2014 0610   CHOLHDL 3.3 02/13/2014 0610   VLDL 35 02/13/2014 0610   LDLCALC 108* 02/13/2014 0610    No results found for this basename: HGBA1C   No results found for this basename: labopia,  cocainscrnur,  labbenz,  amphetmu,  thcu,  labbarb    No results found for this basename: ETH,  in the last 168 hours  Ct Head Wo Contrast 02/12/2014    Unremarkable noncontrast CT of the head.     MRI / MRA 02/12/2014 Sub cm acute infarction affecting the lateral right thalamus.  Otherwise normal appearance of the brain. No sign of hemorrhage or mass effect.  Normal intracranial MR angiography of the large and medium size vessels.   PHYSICAL EXAM Gen: NAD, conversant  CV: RRR, No Murmurs ABD: Soft, NT Mental Status:  Patient is awake, alert, oriented to person, place, month, year, and situation.  Immediate and remote memory are intact.  Patient is able to give a clear and coherent history.  No signs of aphasia or neglect  Cranial Nerves:  II: Visual Fields are full. Pupils are equal, round, and reactive to light. Discs are difficult to visualize.  III,IV, VI: EOMI without ptosis or diploplia.  V: Facial sensation is  symmetric to temperature  VII: Left nasolabial flattening.  VIII: hearing is intact to voice  X: Uvula elevates symmetrically  XI: Shoulder shrug is symmetric.  XII: tongue is midline without atrophy or fasciculations.  Motor:  Tone is normal. Bulk is normal. 5/5 strength was present in all four extremities. However mild left arm drift. Sensory:  Decreased sensation to light touch in the left upper extremity Deep Tendon Reflexes:  2+ and symmetric in the biceps and patellae.  Plantars:  Toes are downgoing bilaterally.  Cerebellar:  FNF and HKS are intact bilaterally    ASSESSMENT/PLAN [name] is a 63 y.o. male with no significant past medical history  presenting with numbness  of his left arm and face.Marland Kitchen He did not receive IV t-PA as his symptoms were mild. CT of the head was unremarkable.    MRI -Sub cm acute infarction affecting the lateral right thalamus secondary to small vessel disease.  no antithrombotics prior to admission, now on aspirin 325 mg orally every day  MRA  Normal intracranial MR angiography of the large and medium size vessels. Carotid Doppler  No evidence of hemodynamically significant internal carotid artery stenosis. Vertebral artery flow is antegrade.  2D Echocardiogram  EF 55-60% with no source of embolus.   Ongoing aggressive risk factor management  Risk factor education  Patient counseled to be compliant with his antithrombotic medications and follow up with primary care (despite not liking doctors)  Hypertension - no previous history of hypertension however BP was initially elevated.  Home meds: - No antihypertensive medications prior to admission BP 127-146/78-93 past 24h (02/13/2014 @ 12:11 PM)  SBP goal normotensive  Stable  Hyperlipidemia - no previous history of hyperlipidemia.  Home meds: - No lipid lowering medications prior to admission.  LDL 108  Added low dose statin  LDL goal < 100 (<70 for diabetics)  Diabetes - no previous history of diabetes  Home meds: - No diabetic medications prior to admission  HgbA1c - pending  Goal < 7.0  Educated patient about lifestyle changes for diabetes prevention  Other Stroke Risk Factors Advanced age Chews tobacco  No further stroke workup indicated.  Patient has a 10-15% risk of having another stroke over the next year, the highest risk is within 2 weeks of the most recent stroke/TIA (risk of having a stroke following a stroke or TIA is the same).  Ongoing risk factor control by Primary Care Physician  Stroke Service will sign off. Please call should any needs arise.  Follow up with Dr. Roda Shutters, Stroke Clinic, in 2 months.  Hospital day # 1  Annie Main, MSN, RN,  ANVP-BC, ANP-BC, [facility] Pager: [phone] 02/13/2014 12:11 PM  I have personally examined this patient, reviewed notes, independently viewed imaging studies, participated in medical decision making and plan of care. I have made any additions or clarifications directly to the above note. Agree with note above.   Delia Heady, MD Medical Director [facility] Pager: [phone] 02/13/2014 7:30 PM     To contact Stroke Continuity provider, please refer to [email or web address]. After hours, contact General Neurology

## 2014-02-13 NOTE — Progress Notes (Signed)
Discharge orders received. Pt for discharge home today. IV d/c'd. Pt given discharge instructions and prescriptions with verbalized understanding. Family aware of discharge. Volunteer brought pt downstairs via wheelchair. Pt called a cab for transport home.

## 2014-02-13 NOTE — Progress Notes (Signed)
VASCULAR LAB PRELIMINARY  PRELIMINARY  PRELIMINARY  PRELIMINARY  Carotid Dopplers completed.    Preliminary report:  1-395 ICA stenosis.  Vertebral artery flow is antegrade.    Capri Veals, RVT 02/13/2014, 11:08 AM

## 2014-06-29 ENCOUNTER — Emergency Department (HOSPITAL_COMMUNITY)
Admission: EM | Admit: 2014-06-29 | Discharge: 2014-06-30 | Disposition: A | Discharge status: Home / Self Care | Payer: Medicare Other | Attending: Emergency Medicine | Admitting: Emergency Medicine

## 2014-06-29 ENCOUNTER — Encounter (HOSPITAL_COMMUNITY): Payer: Self-pay | Admitting: *Deleted

## 2014-06-29 DIAGNOSIS — Z79899 Other long term (current) drug therapy: Secondary | ICD-10-CM | POA: Insufficient documentation

## 2014-06-29 DIAGNOSIS — I10 Essential (primary) hypertension: Secondary | ICD-10-CM | POA: Diagnosis not present

## 2014-06-29 DIAGNOSIS — R55 Syncope and collapse: Secondary | ICD-10-CM | POA: Insufficient documentation

## 2014-06-29 DIAGNOSIS — Z8673 Personal history of transient ischemic attack (TIA), and cerebral infarction without residual deficits: Secondary | ICD-10-CM | POA: Diagnosis not present

## 2014-06-29 DIAGNOSIS — R42 Dizziness and giddiness: Secondary | ICD-10-CM | POA: Diagnosis not present

## 2014-06-29 DIAGNOSIS — Z7982 Long term (current) use of aspirin: Secondary | ICD-10-CM | POA: Insufficient documentation

## 2014-06-29 DIAGNOSIS — R112 Nausea with vomiting, unspecified: Secondary | ICD-10-CM | POA: Insufficient documentation

## 2014-06-29 DIAGNOSIS — E780 Pure hypercholesterolemia: Secondary | ICD-10-CM | POA: Insufficient documentation

## 2014-06-29 DIAGNOSIS — R4020 Unspecified coma: Secondary | ICD-10-CM

## 2014-06-29 HISTORY — DX: Essential (primary) hypertension: I10

## 2014-06-29 HISTORY — DX: Pure hypercholesterolemia, unspecified: E78.00

## 2014-06-29 HISTORY — DX: Cerebral infarction, unspecified: I63.9

## 2014-06-29 LAB — I-STAT TROPONIN, ED: Troponin i, poc: 0 ng/mL (ref 0.00–0.08)

## 2014-06-29 LAB — CBC
HCT: 37.1 % — ABNORMAL LOW (ref 39.0–52.0)
Hemoglobin: 11.8 g/dL — ABNORMAL LOW (ref 13.0–17.0)
MCH: 22.6 pg — ABNORMAL LOW (ref 26.0–34.0)
MCHC: 31.8 g/dL (ref 30.0–36.0)
MCV: 71.2 fL — AB (ref 78.0–100.0)
PLATELETS: 181 10*3/uL (ref 150–400)
RBC: 5.21 MIL/uL (ref 4.22–5.81)
RDW: 14.9 % (ref 11.5–15.5)
WBC: 8 10*3/uL (ref 4.0–10.5)

## 2014-06-29 MED ORDER — SODIUM CHLORIDE 0.9 % IV BOLUS (SEPSIS)
500.0000 mL | Freq: Once | INTRAVENOUS | Status: AC
  Administered 2014-06-29: 500 mL via INTRAVENOUS

## 2014-06-29 NOTE — ED Notes (Signed)
Pt arrives from home via [facility] c/o LOC/syncope. Per EMS pt awoke to go to the bathroom became very dizzy and nauseous and had a syncopal episode. Pt vomited a large amount per family member. Pt denies any symptoms at this time. 132/72 135CBG 84HR 16R 100% RA. 20G LHand

## 2014-06-29 NOTE — ED Provider Notes (Signed)
CSN: [id number]     Arrival date & time 06/29/14  2236 History  This chart was scribed for Edward Raceravid Ninnie Fein, MD by Abel PrestoKara Demonbreun, ED Scribe. This patient was seen in room A12C/A12C and the patient's care was started at 11:09 PM.     Chief Complaint  Patient presents with  . Loss of Consciousness    Patient is a 64 y.o. male presenting with syncope. The history is provided by the patient and a relative. No language interpreter was used.  Loss of Consciousness Associated symptoms: dizziness, nausea and vomiting   Associated symptoms: no chest pain, no fever, no headaches, no seizures, no shortness of breath and no weakness    HPI Comments: Edward Morrow is a 64 y.o. male with PMHx of CVA, HTN, and hypercholesteremia who presents to the Emergency Department complaining of LOC PTA.  Pt woke to use the restroom this evening and states he felt dizzy. Pt does not remember passing out but his family member notes he was out for approximately 20 seconds. Pt notes associated nausea and vomiting after he passed out.  He states symptoms have currently resolved. No similar symptoms this evening to prior stroke. Pt states BP medication was changed in 02/2014. Pt states he is compliant with his medication. Family member denies head injury and seizures during syncopal episode and denies slurred speech after.  Pt denies diarrhea, tunnel vision, abdominal pain, chest pain, and SOB.  Past Medical History  Diagnosis Date  . Stroke   . Hypertension   . High cholesterol    History reviewed. No pertinent past surgical history. No family history on file. History  Substance Use Topics  . Smoking status: Never Smoker   . Smokeless tobacco: Not on file  . Alcohol Use: No    Review of Systems  Constitutional: Negative for fever and chills.  Eyes: Negative for visual disturbance.  Respiratory: Negative for shortness of breath.   Cardiovascular: Positive for syncope. Negative for chest pain.  Gastrointestinal:  Positive for nausea and vomiting. Negative for abdominal pain, diarrhea and constipation.  Skin: Negative for rash and wound.  Neurological: Positive for dizziness and syncope. Negative for seizures, speech difficulty, weakness, numbness and headaches.  All other systems reviewed and are negative.     Allergies  Review of patient's allergies indicates no known allergies.  Home Medications   Prior to Admission medications   Medication Sig Start Date End Date Taking? Authorizing Provider  amlodipine-benazepril (LOTREL) 2.5-10 MG per capsule Take 1 capsule by mouth daily.   Yes Historical Provider, MD  aspirin 325 MG tablet Take 1 tablet (325 mg total) by mouth daily. 02/13/14  Yes Belkys A Regalado, MD  cholecalciferol (VITAMIN D) 1000 UNITS tablet Take 2,000 Units by mouth daily.   Yes Historical Provider, MD  simvastatin (ZOCOR) 20 MG tablet Take 1 tablet (20 mg total) by mouth daily at 6 PM. 02/13/14  Yes Belkys A Regalado, MD   BP 105/67 mmHg  Pulse 65  Temp(Src) 98.2 F (36.8 C) (Oral)  Resp 14  Ht 5\' 6"  (1.676 m)  Wt 151 lb (68.493 kg)  BMI 24.38 kg/m2  SpO2 98% Physical Exam  Constitutional: He is oriented to person, place, and time. He appears well-developed and well-nourished. No distress.  HENT:  Head: Normocephalic and atraumatic.  Mouth/Throat: Oropharynx is clear and moist.  Eyes: Conjunctivae and EOM are normal. Pupils are equal, round, and reactive to light.  Neck: Normal range of motion. Neck supple.  No post  cervical tenderness  Cardiovascular: Normal rate and regular rhythm.   Pulmonary/Chest: Effort normal and breath sounds normal. No respiratory distress. He has no wheezes. He has no rales. He exhibits no tenderness.  Abdominal: Soft. Bowel sounds are normal. He exhibits no distension and no mass. There is no tenderness. There is no rebound and no guarding.  Musculoskeletal: Normal range of motion. He exhibits no edema or tenderness.  No calf swelling or  tenderness  Neurological: He is alert and oriented to person, place, and time.  5/5 motor in all ext, sensation intact, finger to nose bl intact.   Skin: Skin is warm and dry. No rash noted. No erythema.  Psychiatric: He has a normal mood and affect. His behavior is normal.  Nursing note and vitals reviewed.   ED Course  Procedures (including critical care time) DIAGNOSTIC STUDIES: Oxygen Saturation is 100% on room air, normal by my interpretation.    COORDINATION OF CARE: 11:15 PM Discussed treatment plan with patient at beside, the patient agrees with the plan and has no further questions at this time.   Labs Review Labs Reviewed  CBC - Abnormal; Notable for the following:    Hemoglobin 11.8 (*)    HCT 37.1 (*)    MCV 71.2 (*)    MCH 22.6 (*)    All other components within normal limits  BASIC METABOLIC PANEL - Abnormal; Notable for the following:    Potassium 3.3 (*)    Glucose, Bld 148 (*)    GFR calc non Af Amer 63 (*)    GFR calc Af Amer 73 (*)    All other components within normal limits  I-STAT TROPOININ, ED    Imaging Review Ct Head Wo Contrast  06/30/2014   CLINICAL DATA:  Loss of consciousness. Dizziness with episode of syncope.  EXAM: CT HEAD WITHOUT CONTRAST  TECHNIQUE: Contiguous axial images were obtained from the base of the skull through the vertex without intravenous contrast.  COMPARISON:  Head CT and brain MRI 02/12/2014  FINDINGS: No intracranial hemorrhage, mass effect, or midline shift. No hydrocephalus. The basilar cisterns are patent. No evidence of territorial infarct. No definite abnormality corresponding to the right lateral colonic infarct on prior MRI. No intracranial fluid collection. Calvarium is intact. Included paranasal sinuses and mastoid air cells are well aerated.  IMPRESSION: No acute intracranial abnormality.   Electronically Signed   By: Rubye Oaks M.D.   On: 06/30/2014 01:32     EKG Interpretation   Date/Time:  Thursday June 29 2014 22:48:15 EST Ventricular Rate:  74 PR Interval:  201 QRS Duration: 101 QT Interval:  409 QTC Calculation: 454 R Axis:   -18 Text Interpretation:  Sinus rhythm Borderline left axis deviation Low  voltage, precordial leads Abnormal R-wave progression, early transition  Confirmed by Ranae Palms  MD, Danthony Kendrix ([id number]) on 06/29/2014 11:06:50 PM      MDM   Final diagnoses:  LOC (loss of consciousness)   I personally performed the services described in this documentation, which was scribed in my presence. The recorded information has been reviewed and is accurate.  Patient's workup was essentially negative. Remains asymptomatic. Will ambulate in the ED. Anticipate discharge. Patient's been advised to follow-up with his primary physician. Return precautions have been given.     Edward Racer, MD 06/30/14 [phone]

## 2014-06-30 ENCOUNTER — Emergency Department (HOSPITAL_COMMUNITY): Payer: Medicare Other

## 2014-06-30 ENCOUNTER — Encounter (HOSPITAL_COMMUNITY): Payer: Self-pay

## 2014-06-30 LAB — BASIC METABOLIC PANEL
ANION GAP: 9 (ref 5–15)
BUN: 15 mg/dL (ref 6–23)
CO2: 26 mmol/L (ref 19–32)
CREATININE: 1.18 mg/dL (ref 0.50–1.35)
Calcium: 9.1 mg/dL (ref 8.4–10.5)
Chloride: 102 mmol/L (ref 96–112)
GFR calc Af Amer: 73 mL/min — ABNORMAL LOW (ref >90)
GFR, EST NON AFRICAN AMERICAN: 63 mL/min — AB (ref >90)
Glucose, Bld: 148 mg/dL — ABNORMAL HIGH (ref 70–99)
Potassium: 3.3 mmol/L — ABNORMAL LOW (ref 3.5–5.1)
Sodium: 137 mmol/L (ref 135–145)

## 2014-06-30 NOTE — Discharge Instructions (Signed)
Syncope °Syncope is a medical term for fainting or passing out. This means you lose consciousness and drop to the ground. People are generally unconscious for less than 5 minutes. You may have some muscle twitches for up to 15 seconds before waking up and returning to normal. Syncope occurs more often in older adults, but it can happen to anyone. While most causes of syncope are not dangerous, syncope can be a sign of a serious medical problem. It is important to seek medical care.  °CAUSES  °Syncope is caused by a sudden drop in blood flow to the brain. The specific cause is often not determined. Factors that can bring on syncope include: °· Taking medicines that lower blood pressure. °· Sudden changes in posture, such as standing up quickly. °· Taking more medicine than prescribed. °· Standing in one place for too long. °· Seizure disorders. °· Dehydration and excessive exposure to heat. °· Low blood sugar (hypoglycemia). °· Straining to have a bowel movement. °· Heart disease, irregular heartbeat, or other circulatory problems. °· Fear, emotional distress, seeing blood, or severe pain. °SYMPTOMS  °Right before fainting, you may: °· Feel dizzy or light-headed. °· Feel nauseous. °· See all white or all black in your field of vision. °· Have cold, clammy skin. °DIAGNOSIS  °Your health care provider will ask about your symptoms, perform a physical exam, and perform an electrocardiogram (ECG) to record the electrical activity of your heart. Your health care provider may also perform other heart or blood tests to determine the cause of your syncope which may include: °· Transthoracic echocardiogram (TTE). During echocardiography, sound waves are used to evaluate how blood flows through your heart. °· Transesophageal echocardiogram (TEE). °· Cardiac monitoring. This allows your health care provider to monitor your heart rate and rhythm in real time. °· Holter monitor. This is a portable device that records your  heartbeat and can help diagnose heart arrhythmias. It allows your health care provider to track your heart activity for several days, if needed. °· Stress tests by exercise or by giving medicine that makes the heart beat faster. °TREATMENT  °In most cases, no treatment is needed. Depending on the cause of your syncope, your health care provider may recommend changing or stopping some of your medicines. °HOME CARE INSTRUCTIONS °· Have someone stay with you until you feel stable. °· Do not drive, use machinery, or play sports until your health care provider says it is okay. °· Keep all follow-up appointments as directed by your health care provider. °· Lie down right away if you start feeling like you might faint. Breathe deeply and steadily. Wait until all the symptoms have passed. °· Drink enough fluids to keep your urine clear or pale yellow. °· If you are taking blood pressure or heart medicine, get up slowly and take several minutes to sit and then stand. This can reduce dizziness. °SEEK IMMEDIATE MEDICAL CARE IF:  °· You have a severe headache. °· You have unusual pain in the chest, abdomen, or back. °· You are bleeding from your mouth or rectum, or you have black or tarry stool. °· You have an irregular or very fast heartbeat. °· You have pain with breathing. °· You have repeated fainting or seizure-like jerking during an episode. °· You faint when sitting or lying down. °· You have confusion. °· You have trouble walking. °· You have severe weakness. °· You have vision problems. °If you fainted, call your local emergency services (911 in [place] Do not drive   yourself to the hospital.  °MAKE SURE YOU: °· Understand these instructions. °· Will watch your condition. °· Will get help right away if you are not doing well or get worse. °Document Released: 05/12/2005 Document Revised: 05/17/2013 Document Reviewed: 07/11/2011 °[employer]® Patient Information ©2015 [employer]. This information is not intended to replace  advice given to you by your health care provider. Make sure you discuss any questions you have with your health care provider. ° °

## 2014-06-30 NOTE — ED Notes (Signed)
Pt walked with no problems in the hallway.

## 2017-06-12 ENCOUNTER — Encounter (HOSPITAL_COMMUNITY): Payer: Self-pay | Admitting: Emergency Medicine

## 2017-06-12 ENCOUNTER — Encounter (HOSPITAL_COMMUNITY): Payer: Self-pay | Admitting: *Deleted

## 2017-06-12 ENCOUNTER — Ambulatory Visit (HOSPITAL_COMMUNITY)
Admission: EM | Admit: 2017-06-12 | Discharge: 2017-06-12 | Disposition: A | Discharge status: Home / Self Care | Payer: Medicare Other | Source: Home / Self Care | Attending: Emergency Medicine | Admitting: Emergency Medicine

## 2017-06-12 ENCOUNTER — Emergency Department (HOSPITAL_COMMUNITY)
Admission: EM | Admit: 2017-06-12 | Discharge: 2017-06-12 | Disposition: A | Discharge status: Home / Self Care | Payer: Medicare Other | Attending: Emergency Medicine | Admitting: Emergency Medicine

## 2017-06-12 ENCOUNTER — Other Ambulatory Visit: Payer: Self-pay

## 2017-06-12 DIAGNOSIS — I10 Essential (primary) hypertension: Secondary | ICD-10-CM | POA: Diagnosis not present

## 2017-06-12 DIAGNOSIS — Z79899 Other long term (current) drug therapy: Secondary | ICD-10-CM | POA: Diagnosis not present

## 2017-06-12 DIAGNOSIS — Z7982 Long term (current) use of aspirin: Secondary | ICD-10-CM | POA: Insufficient documentation

## 2017-06-12 DIAGNOSIS — T783XXA Angioneurotic edema, initial encounter: Secondary | ICD-10-CM | POA: Insufficient documentation

## 2017-06-12 DIAGNOSIS — R220 Localized swelling, mass and lump, head: Secondary | ICD-10-CM | POA: Diagnosis present

## 2017-06-12 LAB — BASIC METABOLIC PANEL
Anion gap: 12 (ref 5–15)
BUN: 14 mg/dL (ref 6–20)
CO2: 24 mmol/L (ref 22–32)
CREATININE: 1.04 mg/dL (ref 0.61–1.24)
Calcium: 9.3 mg/dL (ref 8.9–10.3)
Chloride: 105 mmol/L (ref 101–111)
Glucose, Bld: 97 mg/dL (ref 65–99)
Potassium: 4 mmol/L (ref 3.5–5.1)
Sodium: 141 mmol/L (ref 135–145)

## 2017-06-12 LAB — CBC
HEMATOCRIT: 41.9 % (ref 39.0–52.0)
Hemoglobin: 12.9 g/dL — ABNORMAL LOW (ref 13.0–17.0)
MCH: 22.3 pg — ABNORMAL LOW (ref 26.0–34.0)
MCHC: 30.8 g/dL (ref 30.0–36.0)
MCV: 72.4 fL — ABNORMAL LOW (ref 78.0–100.0)
PLATELETS: 207 10*3/uL (ref 150–400)
RBC: 5.79 MIL/uL (ref 4.22–5.81)
RDW: 15 % (ref 11.5–15.5)
WBC: 7.6 10*3/uL (ref 4.0–10.5)

## 2017-06-12 MED ORDER — FAMOTIDINE 20 MG PO TABS
20.0000 mg | ORAL_TABLET | Freq: Once | ORAL | Status: AC
  Administered 2017-06-12: 20 mg via ORAL
  Filled 2017-06-12: qty 1

## 2017-06-12 MED ORDER — FAMOTIDINE 20 MG PO TABS: 40.0000 mg | ORAL_TABLET | Freq: Once | ORAL | Status: DC

## 2017-06-12 MED ORDER — DEXAMETHASONE SODIUM PHOSPHATE 4 MG/ML IJ SOLN
10.0000 mg | Freq: Once | INTRAMUSCULAR | Status: AC
  Administered 2017-06-12: 10 mg via INTRAVENOUS
  Filled 2017-06-12: qty 3

## 2017-06-12 MED ORDER — FAMOTIDINE 20 MG PO TABS: 20.0000 mg | ORAL_TABLET | Freq: Two times a day (BID) | ORAL | 0 refills | Status: DC

## 2017-06-12 MED ORDER — PREDNISONE 20 MG PO TABS: 60.0000 mg | ORAL_TABLET | Freq: Once | ORAL | Status: DC

## 2017-06-12 MED ORDER — EPINEPHRINE PF 1 MG/ML IJ SOLN: 0.3000 mg | Freq: Once | INTRAMUSCULAR | Status: DC

## 2017-06-12 MED ORDER — AMLODIPINE BESYLATE 10 MG PO TABS: 10.0000 mg | ORAL_TABLET | Freq: Every day | ORAL | 1 refills | Status: DC

## 2017-06-12 NOTE — ED Triage Notes (Signed)
Pt c/o upper lip swelling onset today, denies SOB and airway intact, pt reports taking amlodipine, pt sent here from [facility], pt A&O x4, pt reports onset earlier today at lunch time

## 2017-06-12 NOTE — ED Provider Notes (Signed)
HPI  SUBJECTIVE:  Edward Morrow is a 67 y.o. male who presents with atraumatic painless angioedema of his upper lip guarding earlier today.  Denies pain, itching, burning pain.  No tongue swelling.  No sensation of throat swelling shut.  No voice changes, wheezing, shortness of breath, abdominal pain, diarrhea, syncope.  He states that he had similar symptoms like this before when he was bitten by an insect but states that it was only one side of his lip that swelled.  He has a past medical history of hypertension for which he takes an ACE inhibitor.  States he is compliant with this.  No new lotions, soaps, detergents, foods, drinks.  There are no aggravating or alleviating factors.  He has not tried anything for this.  Past medical history of hypertension, hypercholesterolemia, stroke.  Family history negative for familial angioedema.  GNF:AOZHY, Molly Maduro, MD  Past Medical History:  Diagnosis Date  . High cholesterol   . Hypertension   . Stroke [facility])     History reviewed. No pertinent surgical history.  No family history on file.  Social History   Tobacco Use  . Smoking status: Never Smoker  Substance Use Topics  . Alcohol use: No  . Drug use: No    No current facility-administered medications for this encounter.   Current Outpatient Medications:  .  aspirin 325 MG tablet, Take 1 tablet (325 mg total) by mouth daily., Disp: 30 tablet, Rfl: 0 .  cholecalciferol (VITAMIN D) 1000 UNITS tablet, Take 2,000 Units by mouth daily., Disp: , Rfl:  .  simvastatin (ZOCOR) 20 MG tablet, Take 1 tablet (20 mg total) by mouth daily at 6 PM., Disp: 30 tablet, Rfl: 0  No Known Allergies   ROS  As noted in HPI.   Physical Exam  BP (!) 149/83   Pulse 94   Temp 98.2 F (36.8 C)   Resp 18   SpO2 100%   Constitutional: Well developed, well nourished, no acute distress Eyes:  EOMI, conjunctiva normal bilaterally HENT: Normocephalic, atraumatic,mucus membranes moist.  nonTender edema of  the upper lip.  Skin intact.  Gingiva intact, nontender.  No tongue swelling.  No lower lip swelling.  Airway widely patent.  See picture     Respiratory: Normal inspiratory effort, lungs clear bilaterally Cardiovascular: Normal rate GI: nondistended skin: No rash, skin intact Musculoskeletal: no deformities Neurologic: Alert & oriented x 3, no focal neuro deficits Psychiatric: Speech and behavior appropriate   ED Course   Medications - No data to display  No orders of the defined types were placed in this encounter.   No results found for this or any previous visit (from the past 24 hour(s)). No results found.  ED Clinical Impression  Angioedema, initial encounter   ED Assessment/Plan  Transferring to the ED for treatment and observation.  Patient may benefit from steroids, histamine blockers and epinephrine.  He has no evidence of impending airway failure at this point in time and I think he is stable to go by private vehicle.  However emphasized with him that this could become very severe very rapidly and that he needs to go immediately to the emergency department.  Discussed  MDM, plan and followup with patient.  Patient agrees to go to the [facility] ER immediately.  Meds ordered this encounter  Medications  . DISCONTD: predniSONE (DELTASONE) tablet 60 mg  . DISCONTD: famotidine (PEPCID) tablet 40 mg  . DISCONTD: EPINEPHrine (ADRENALIN) 0.3 mg    *This clinic note  was created using Scientist, clinical (histocompatibility and immunogenetics)Dragon dictation software. Therefore, there may be occasional mistakes despite careful proofreading.   ?   Domenick GongMortenson, Eian Vandervelden, MD 06/12/17 1728

## 2017-06-12 NOTE — Discharge Instructions (Signed)
Never take that blood pressure medication again, it contains a medicine called an ACE inhibitor which is well known to cause swelling of the face lips tongue or throat.  If you take that medicine again it could cause death.  Please start taking amlodipine 10 mg by mouth daily.  I have given you a prescription for this.  You may start it tomorrow.  Please follow-up with your doctor within the next week for a blood pressure check in the office and let them know about this new allergy.  Take Pepcid twice a day for the next 2 weeks, take Benadryl every 6 hours, do not drive while taking Benadryl.  You should take Benadryl for the next 2 days.

## 2017-06-12 NOTE — ED Notes (Signed)
Pt noted to have some upper lip swelling. Denies any difficulty swallowing. No resp. Difficulty.

## 2017-06-12 NOTE — ED Triage Notes (Signed)
Pt c/o upper lip swelling today. Denies any changes in diet or medications. No pain, no sob

## 2017-06-12 NOTE — ED Provider Notes (Signed)
[facility] Provider Note   CSN: [id number] Arrival date & time: 06/12/17  1733     History   Chief Complaint Chief Complaint  Patient presents with  . Angioedema    HPI Edward Morrow is a 67 y.o. male.  HPI  The patient is a 67 year old male, he has a known history of prior stroke, prior high cholesterol and high blood pressure and currently takes daily blood pressure medication including a combination amlodipine and ACE inhibitor.  The patient reports that he woke up this morning with a small amount of swelling on his lip which progressed to involve both sides of the upper lip throughout the day.  He went to the urgent care this evening and was referred to the emergency department.  The patient denies any shortness of breath or itching or rashes and has no swelling of his lower lip or his tongue or his throat.  He has had no medications prior to arrival.  The symptoms are persistent and he states they are actually slightly improving.  He has been on these medications for over 2 years  Past Medical History:  Diagnosis Date  . High cholesterol   . Hypertension   . Stroke [facility](H[facility])     Patient Active Problem List   Diagnosis Date Noted  . TIA (transient ischemic attack) 02/12/2014  . Thalamic infarct, right 02/12/2014  . Uncontrolled hypertension 02/12/2014    History reviewed. No pertinent surgical history.     Home Medications    Prior to Admission medications   Medication Sig Start Date End Date Taking? Authorizing Provider  aspirin 325 MG tablet Take 1 tablet (325 mg total) by mouth daily. 02/13/14  Yes Regalado, Belkys A, MD  simvastatin (ZOCOR) 20 MG tablet Take 1 tablet (20 mg total) by mouth daily at 6 PM. 02/13/14  Yes Regalado, Belkys A, MD  amLODipine (NORVASC) 10 MG tablet Take 1 tablet (10 mg total) by mouth daily. 06/12/17   Eber HongMiller, Iann Rodier, MD  famotidine (PEPCID) 20 MG tablet Take 1 tablet (20 mg total) by mouth 2 (two)  times daily. 06/12/17   Eber HongMiller, Tevis Conger, MD    Family History No family history on file.  Social History Social History   Tobacco Use  . Smoking status: Never Smoker  . Smokeless tobacco: Current User    Types: Chew  Substance Use Topics  . Alcohol use: No  . Drug use: No     Allergies   Patient has no known allergies.   Review of Systems Review of Systems  All other systems reviewed and are negative.    Physical Exam Updated Vital Signs BP 113/77 (BP Location: Left Arm)   Pulse 83   Temp 98.3 F (36.8 C) (Oral)   Resp 18   Ht 5\' 5"  (1.651 m)   Wt 68.9 kg (152 lb)   SpO2 97%   BMI 25.29 kg/m   Physical Exam  Constitutional: He appears well-developed and well-nourished. No distress.  HENT:  Head: Normocephalic and atraumatic.  Mouth/Throat: Oropharynx is clear and moist. No oropharyngeal exudate.  Slight swelling of the upper lip bilaterally, this is symmetrical, nontender, no erythema, oropharynx is clear and moist with normal-appearing tongue and normal phonation.  Eyes: Conjunctivae and EOM are normal. Pupils are equal, round, and reactive to light. Right eye exhibits no discharge. Left eye exhibits no discharge. No scleral icterus.  Neck: Normal range of motion. Neck supple. No JVD present. No thyromegaly present.  Cardiovascular:  Normal rate, regular rhythm, normal heart sounds and intact distal pulses. Exam reveals no gallop and no friction rub.  No murmur heard. Pulmonary/Chest: Effort normal and breath sounds normal. No respiratory distress. He has no wheezes. He has no rales.  Abdominal: Soft. Bowel sounds are normal. He exhibits no distension and no mass. There is no tenderness.  Musculoskeletal: Normal range of motion. He exhibits no edema or tenderness.  Lymphadenopathy:    He has no cervical adenopathy.  Neurological: He is alert. Coordination normal.  Skin: Skin is warm and dry. No rash noted. No erythema.  Psychiatric: He has a normal mood and  affect. His behavior is normal.  Nursing note and vitals reviewed.    ED Treatments / Results  Labs (all labs ordered are listed, but only abnormal results are displayed) Labs Reviewed  CBC - Abnormal; Notable for the following components:      Result Value   Hemoglobin 12.9 (*)    MCV 72.4 (*)    MCH 22.3 (*)    All other components within normal limits  BASIC METABOLIC PANEL     Radiology No results found.  Procedures Procedures (including critical care time)  Medications Ordered in ED Medications  dexamethasone (DECADRON) injection 10 mg (not administered)  famotidine (PEPCID) tablet 20 mg (not administered)     Initial Impression / Assessment and Plan / ED Course  I have reviewed the triage vital signs and the nursing notes.  Pertinent labs & imaging results that were available during my care of the patient were reviewed by me and considered in my medical decision making (see chart for details).     The patient is very well-appearing, no signs of airway compromise, he will be given Pepcid and Decadron, I see no need for ongoing steroid therapy or significant antihistamine therapy this evening as he is driving himself home and that he does not need to be sedated however single dose of Decadron and Pepcid will be given, Benadryl for home, I have encouraged him very boldly never to take that blood pressure medicine again, he will tell his doctor about his ACE inhibitor intolerance because of this new allergy.  We will write him for an amlodipine prescription and he can follow-up for further treatment of blood pressure if he does not have good control with monotherapy.  Final Clinical Impressions(s) / ED Diagnoses   Final diagnoses:  Angioedema, initial encounter    ED Discharge Orders        Ordered    famotidine (PEPCID) 20 MG tablet  2 times daily     06/12/17 2155    amLODipine (NORVASC) 10 MG tablet  Daily     06/12/17 2156       Eber Hong,  MD 06/12/17 2158

## 2017-06-12 NOTE — ED Notes (Signed)
Patient continues to wait in chair in POD A hallway.

## 2017-06-12 NOTE — Discharge Instructions (Signed)
You will need to discontinue your amlodipine/benazepril blood pressure medicine.  You will need to start a different kind of medicine because I think that your swelling is coming from the benazepril.  Call your doctor tomorrow and see what other options you have for controlling your blood pressure.

## 2017-09-09 ENCOUNTER — Other Ambulatory Visit: Payer: Self-pay | Admitting: Internal Medicine

## 2017-09-09 ENCOUNTER — Ambulatory Visit
Admission: RE | Admit: 2017-09-09 | Discharge: 2017-09-09 | Disposition: A | Discharge status: Home / Self Care | Payer: Medicare Other | Source: Ambulatory Visit | Attending: Internal Medicine | Admitting: Internal Medicine

## 2017-09-09 DIAGNOSIS — M5412 Radiculopathy, cervical region: Secondary | ICD-10-CM

## 2018-12-10 ENCOUNTER — Ambulatory Visit (INDEPENDENT_AMBULATORY_CARE_PROVIDER_SITE_OTHER)
Admission: EM | Admit: 2018-12-10 | Discharge: 2018-12-10 | Disposition: A | Discharge status: Home / Self Care | Payer: Medicare Other | Source: Home / Self Care | Attending: Internal Medicine | Admitting: Internal Medicine

## 2018-12-10 ENCOUNTER — Inpatient Hospital Stay (HOSPITAL_COMMUNITY): Payer: Medicare Other

## 2018-12-10 ENCOUNTER — Telehealth (HOSPITAL_COMMUNITY): Payer: Self-pay | Admitting: Emergency Medicine

## 2018-12-10 ENCOUNTER — Inpatient Hospital Stay (HOSPITAL_COMMUNITY)
Admission: EM | Admit: 2018-12-10 | Discharge: 2018-12-14 | DRG: 637 | Disposition: A | Discharge status: Home / Self Care | Payer: Medicare Other | Attending: Family Medicine | Admitting: Family Medicine

## 2018-12-10 ENCOUNTER — Encounter (HOSPITAL_COMMUNITY): Payer: Self-pay

## 2018-12-10 ENCOUNTER — Other Ambulatory Visit: Payer: Self-pay

## 2018-12-10 ENCOUNTER — Emergency Department (HOSPITAL_COMMUNITY): Payer: Medicare Other

## 2018-12-10 DIAGNOSIS — E870 Hyperosmolality and hypernatremia: Secondary | ICD-10-CM | POA: Diagnosis not present

## 2018-12-10 DIAGNOSIS — E1110 Type 2 diabetes mellitus with ketoacidosis without coma: Principal | ICD-10-CM | POA: Diagnosis present

## 2018-12-10 DIAGNOSIS — Z87891 Personal history of nicotine dependence: Secondary | ICD-10-CM | POA: Diagnosis not present

## 2018-12-10 DIAGNOSIS — R6510 Systemic inflammatory response syndrome (SIRS) of non-infectious origin without acute organ dysfunction: Secondary | ICD-10-CM | POA: Diagnosis present

## 2018-12-10 DIAGNOSIS — N2889 Other specified disorders of kidney and ureter: Secondary | ICD-10-CM | POA: Diagnosis present

## 2018-12-10 DIAGNOSIS — Z823 Family history of stroke: Secondary | ICD-10-CM

## 2018-12-10 DIAGNOSIS — T68XXXA Hypothermia, initial encounter: Secondary | ICD-10-CM

## 2018-12-10 DIAGNOSIS — R42 Dizziness and giddiness: Secondary | ICD-10-CM | POA: Insufficient documentation

## 2018-12-10 DIAGNOSIS — E875 Hyperkalemia: Secondary | ICD-10-CM

## 2018-12-10 DIAGNOSIS — N400 Enlarged prostate without lower urinary tract symptoms: Secondary | ICD-10-CM | POA: Diagnosis present

## 2018-12-10 DIAGNOSIS — K760 Fatty (change of) liver, not elsewhere classified: Secondary | ICD-10-CM | POA: Diagnosis present

## 2018-12-10 DIAGNOSIS — E872 Acidosis, unspecified: Secondary | ICD-10-CM

## 2018-12-10 DIAGNOSIS — Z1159 Encounter for screening for other viral diseases: Secondary | ICD-10-CM

## 2018-12-10 DIAGNOSIS — I10 Essential (primary) hypertension: Secondary | ICD-10-CM | POA: Insufficient documentation

## 2018-12-10 DIAGNOSIS — Z8673 Personal history of transient ischemic attack (TIA), and cerebral infarction without residual deficits: Secondary | ICD-10-CM

## 2018-12-10 DIAGNOSIS — I472 Ventricular tachycardia: Secondary | ICD-10-CM | POA: Diagnosis present

## 2018-12-10 DIAGNOSIS — R571 Hypovolemic shock: Secondary | ICD-10-CM | POA: Insufficient documentation

## 2018-12-10 DIAGNOSIS — N179 Acute kidney failure, unspecified: Secondary | ICD-10-CM

## 2018-12-10 DIAGNOSIS — R531 Weakness: Secondary | ICD-10-CM

## 2018-12-10 DIAGNOSIS — E1310 Other specified diabetes mellitus with ketoacidosis without coma: Secondary | ICD-10-CM

## 2018-12-10 DIAGNOSIS — R9431 Abnormal electrocardiogram [ECG] [EKG]: Secondary | ICD-10-CM

## 2018-12-10 DIAGNOSIS — E876 Hypokalemia: Secondary | ICD-10-CM | POA: Diagnosis not present

## 2018-12-10 DIAGNOSIS — I952 Hypotension due to drugs: Secondary | ICD-10-CM | POA: Insufficient documentation

## 2018-12-10 DIAGNOSIS — E7800 Pure hypercholesterolemia, unspecified: Secondary | ICD-10-CM | POA: Insufficient documentation

## 2018-12-10 DIAGNOSIS — I129 Hypertensive chronic kidney disease with stage 1 through stage 4 chronic kidney disease, or unspecified chronic kidney disease: Secondary | ICD-10-CM | POA: Diagnosis present

## 2018-12-10 DIAGNOSIS — E785 Hyperlipidemia, unspecified: Secondary | ICD-10-CM | POA: Diagnosis present

## 2018-12-10 DIAGNOSIS — Z79899 Other long term (current) drug therapy: Secondary | ICD-10-CM | POA: Insufficient documentation

## 2018-12-10 DIAGNOSIS — K7689 Other specified diseases of liver: Secondary | ICD-10-CM | POA: Diagnosis present

## 2018-12-10 DIAGNOSIS — E781 Pure hyperglyceridemia: Secondary | ICD-10-CM | POA: Diagnosis present

## 2018-12-10 DIAGNOSIS — K859 Acute pancreatitis without necrosis or infection, unspecified: Secondary | ICD-10-CM

## 2018-12-10 DIAGNOSIS — E1122 Type 2 diabetes mellitus with diabetic chronic kidney disease: Secondary | ICD-10-CM | POA: Diagnosis present

## 2018-12-10 DIAGNOSIS — E11649 Type 2 diabetes mellitus with hypoglycemia without coma: Secondary | ICD-10-CM | POA: Diagnosis not present

## 2018-12-10 DIAGNOSIS — J479 Bronchiectasis, uncomplicated: Secondary | ICD-10-CM | POA: Diagnosis not present

## 2018-12-10 DIAGNOSIS — E1010 Type 1 diabetes mellitus with ketoacidosis without coma: Secondary | ICD-10-CM

## 2018-12-10 DIAGNOSIS — Z7982 Long term (current) use of aspirin: Secondary | ICD-10-CM | POA: Insufficient documentation

## 2018-12-10 LAB — CBC WITH DIFFERENTIAL/PLATELET
Abs Immature Granulocytes: 0.14 10*3/uL — ABNORMAL HIGH (ref 0.00–0.07)
Abs Immature Granulocytes: 0.16 10*3/uL — ABNORMAL HIGH (ref 0.00–0.07)
Basophils Absolute: 0 10*3/uL (ref 0.0–0.1)
Basophils Absolute: 0 10*3/uL (ref 0.0–0.1)
Basophils Relative: 0 %
Basophils Relative: 0 %
Eosinophils Absolute: 0 10*3/uL (ref 0.0–0.5)
Eosinophils Absolute: 0 10*3/uL (ref 0.0–0.5)
Eosinophils Relative: 0 %
Eosinophils Relative: 0 %
HCT: 43.3 % (ref 39.0–52.0)
HCT: 43.7 % (ref 39.0–52.0)
Hemoglobin: 12.5 g/dL — ABNORMAL LOW (ref 13.0–17.0)
Hemoglobin: 12.6 g/dL — ABNORMAL LOW (ref 13.0–17.0)
Immature Granulocytes: 1 %
Immature Granulocytes: 1 %
Lymphocytes Relative: 6 %
Lymphocytes Relative: 6 %
Lymphs Abs: 0.8 10*3/uL (ref 0.7–4.0)
Lymphs Abs: 1 10*3/uL (ref 0.7–4.0)
MCH: 21.9 pg — ABNORMAL LOW (ref 26.0–34.0)
MCH: 22.3 pg — ABNORMAL LOW (ref 26.0–34.0)
MCHC: 28.9 g/dL — ABNORMAL LOW (ref 30.0–36.0)
MCHC: 28.8 g/dL — ABNORMAL LOW (ref 30.0–36.0)
MCV: 76 fL — ABNORMAL LOW (ref 80.0–100.0)
MCV: 77.3 fL — ABNORMAL LOW (ref 80.0–100.0)
Monocytes Absolute: 1.1 10*3/uL — ABNORMAL HIGH (ref 0.1–1.0)
Monocytes Absolute: 1.7 10*3/uL — ABNORMAL HIGH (ref 0.1–1.0)
Monocytes Relative: 8 %
Monocytes Relative: 10 %
Neutro Abs: 11.3 10*3/uL — ABNORMAL HIGH (ref 1.7–7.7)
Neutro Abs: 15.3 10*3/uL — ABNORMAL HIGH (ref 1.7–7.7)
Neutrophils Relative %: 85 %
Neutrophils Relative %: 83 %
Platelets: 233 10*3/uL (ref 150–400)
Platelets: 232 10*3/uL (ref 150–400)
RBC: 5.7 MIL/uL (ref 4.22–5.81)
RBC: 5.65 MIL/uL (ref 4.22–5.81)
RDW: 17.5 % — ABNORMAL HIGH (ref 11.5–15.5)
RDW: 18.2 % — ABNORMAL HIGH (ref 11.5–15.5)
WBC: 13.4 10*3/uL — ABNORMAL HIGH (ref 4.0–10.5)
WBC: 18.2 10*3/uL — ABNORMAL HIGH (ref 4.0–10.5)
nRBC: 0.1 % (ref 0.0–0.2)
nRBC: 0.1 % (ref 0.0–0.2)

## 2018-12-10 LAB — LACTIC ACID, PLASMA
Lactic Acid, Venous: 2.5 mmol/L (ref 0.5–1.9)
Lactic Acid, Venous: 2.2 mmol/L (ref 0.5–1.9)

## 2018-12-10 LAB — BASIC METABOLIC PANEL
Anion gap: 31 — ABNORMAL HIGH (ref 5–15)
Anion gap: 26 — ABNORMAL HIGH (ref 5–15)
Anion gap: 23 — ABNORMAL HIGH (ref 5–15)
Anion gap: 16 — ABNORMAL HIGH (ref 5–15)
BUN: 80 mg/dL — ABNORMAL HIGH (ref 8–23)
BUN: 81 mg/dL — ABNORMAL HIGH (ref 8–23)
BUN: 74 mg/dL — ABNORMAL HIGH (ref 8–23)
BUN: 66 mg/dL — ABNORMAL HIGH (ref 8–23)
CO2: 8 mmol/L — ABNORMAL LOW (ref 22–32)
CO2: 13 mmol/L — ABNORMAL LOW (ref 22–32)
CO2: 15 mmol/L — ABNORMAL LOW (ref 22–32)
CO2: 23 mmol/L (ref 22–32)
Calcium: 9.2 mg/dL (ref 8.9–10.3)
Calcium: 9.3 mg/dL (ref 8.9–10.3)
Calcium: 9.2 mg/dL (ref 8.9–10.3)
Calcium: 9.4 mg/dL (ref 8.9–10.3)
Chloride: 101 mmol/L (ref 98–111)
Chloride: 104 mmol/L (ref 98–111)
Chloride: 108 mmol/L (ref 98–111)
Chloride: 113 mmol/L — ABNORMAL HIGH (ref 98–111)
Creatinine, Ser: 4.06 mg/dL — ABNORMAL HIGH (ref 0.61–1.24)
Creatinine, Ser: 3.88 mg/dL — ABNORMAL HIGH (ref 0.61–1.24)
Creatinine, Ser: 3.64 mg/dL — ABNORMAL HIGH (ref 0.61–1.24)
Creatinine, Ser: 2.98 mg/dL — ABNORMAL HIGH (ref 0.61–1.24)
GFR calc Af Amer: 16 mL/min — ABNORMAL LOW (ref >60)
GFR calc Af Amer: 17 mL/min — ABNORMAL LOW (ref >60)
GFR calc Af Amer: 18 mL/min — ABNORMAL LOW (ref >60)
GFR calc Af Amer: 24 mL/min — ABNORMAL LOW (ref >60)
GFR calc non Af Amer: 14 mL/min — ABNORMAL LOW (ref >60)
GFR calc non Af Amer: 15 mL/min — ABNORMAL LOW (ref >60)
GFR calc non Af Amer: 16 mL/min — ABNORMAL LOW (ref >60)
GFR calc non Af Amer: 20 mL/min — ABNORMAL LOW (ref >60)
Glucose, Bld: 1270 mg/dL (ref 70–99)
Glucose, Bld: 1055 mg/dL (ref 70–99)
Glucose, Bld: 903 mg/dL (ref 70–99)
Glucose, Bld: 510 mg/dL (ref 70–99)
Potassium: 6.2 mmol/L — ABNORMAL HIGH (ref 3.5–5.1)
Potassium: 4.5 mmol/L (ref 3.5–5.1)
Potassium: 3.7 mmol/L (ref 3.5–5.1)
Potassium: 3.9 mmol/L (ref 3.5–5.1)
Sodium: 140 mmol/L (ref 135–145)
Sodium: 143 mmol/L (ref 135–145)
Sodium: 146 mmol/L — ABNORMAL HIGH (ref 135–145)
Sodium: 152 mmol/L — ABNORMAL HIGH (ref 135–145)

## 2018-12-10 LAB — URINALYSIS, ROUTINE W REFLEX MICROSCOPIC
Bilirubin Urine: NEGATIVE
Glucose, UA: >=500 mg/dL — AB
Ketones, ur: 20 mg/dL — AB
Leukocytes,Ua: NEGATIVE
Nitrite: NEGATIVE
Protein, ur: NEGATIVE mg/dL
Specific Gravity, Urine: 1.018 (ref 1.005–1.030)
pH: 5 (ref 5.0–8.0)

## 2018-12-10 LAB — COMPREHENSIVE METABOLIC PANEL
ALT: 23 U/L (ref 0–44)
AST: 12 U/L — ABNORMAL LOW (ref 15–41)
Albumin: 3.5 g/dL (ref 3.5–5.0)
Alkaline Phosphatase: 88 U/L (ref 38–126)
Anion gap: 28 — ABNORMAL HIGH (ref 5–15)
BUN: 72 mg/dL — ABNORMAL HIGH (ref 8–23)
CO2: 10 mmol/L — ABNORMAL LOW (ref 22–32)
Calcium: 9.4 mg/dL (ref 8.9–10.3)
Chloride: 104 mmol/L (ref 98–111)
Creatinine, Ser: 4.11 mg/dL — ABNORMAL HIGH (ref 0.61–1.24)
GFR calc Af Amer: 16 mL/min — ABNORMAL LOW (ref >60)
GFR calc non Af Amer: 14 mL/min — ABNORMAL LOW (ref >60)
Glucose, Bld: 1139 mg/dL (ref 70–99)
Potassium: 6 mmol/L — ABNORMAL HIGH (ref 3.5–5.1)
Sodium: 142 mmol/L (ref 135–145)
Total Bilirubin: 1.5 mg/dL — ABNORMAL HIGH (ref 0.3–1.2)
Total Protein: 7.1 g/dL (ref 6.5–8.1)

## 2018-12-10 LAB — BLOOD GAS, ARTERIAL
Acid-base deficit: 16 mmol/L — ABNORMAL HIGH (ref 0.0–2.0)
Bicarbonate: 11.6 mmol/L — ABNORMAL LOW (ref 20.0–28.0)
Drawn by: 103701
FIO2: 21
O2 Saturation: 96.8 %
Patient temperature: 98.6
pCO2 arterial: 33.9 mmHg (ref 32.0–48.0)
pH, Arterial: 7.16 — CL (ref 7.350–7.450)
pO2, Arterial: 107 mmHg (ref 83.0–108.0)

## 2018-12-10 LAB — TROPONIN I (HIGH SENSITIVITY)
Troponin I (High Sensitivity): 5 ng/L (ref <18)
Troponin I (High Sensitivity): 6 ng/L (ref <18)

## 2018-12-10 LAB — SARS CORONAVIRUS 2 BY RT PCR (HOSPITAL ORDER, PERFORMED IN ~~LOC~~ HOSPITAL LAB): SARS Coronavirus 2: NEGATIVE

## 2018-12-10 LAB — CK: Total CK: 107 U/L (ref 49–397)

## 2018-12-10 LAB — BETA-HYDROXYBUTYRIC ACID: Beta-Hydroxybutyric Acid: >8 mmol/L — ABNORMAL HIGH (ref 0.05–0.27)

## 2018-12-10 LAB — CBG MONITORING, ED
Glucose-Capillary: >600 mg/dL (ref 70–99)
Glucose-Capillary: >600 mg/dL (ref 70–99)
Glucose-Capillary: >600 mg/dL (ref 70–99)
Glucose-Capillary: >600 mg/dL (ref 70–99)
Glucose-Capillary: >600 mg/dL (ref 70–99)

## 2018-12-10 LAB — GLUCOSE, CAPILLARY
Glucose-Capillary: 505 mg/dL (ref 70–99)
Glucose-Capillary: 461 mg/dL — ABNORMAL HIGH (ref 70–99)

## 2018-12-10 MED ORDER — CALCIUM GLUCONATE-NACL 1-0.675 GM/50ML-% IV SOLN
1.0000 g | Freq: Once | INTRAVENOUS | Status: AC
  Administered 2018-12-10: 1000 mg via INTRAVENOUS
  Filled 2018-12-10: qty 50

## 2018-12-10 MED ORDER — SODIUM BICARBONATE 8.4 % IV SOLN
50.0000 meq | Freq: Once | INTRAVENOUS | Status: AC
  Administered 2018-12-10: 50 meq via INTRAVENOUS
  Filled 2018-12-10: qty 50

## 2018-12-10 MED ORDER — SODIUM CHLORIDE 0.9 % IV SOLN: 1.0000 g | Freq: Once | INTRAVENOUS | Status: DC

## 2018-12-10 MED ORDER — SODIUM CHLORIDE 0.9 % IV BOLUS
1000.0000 mL | Freq: Once | INTRAVENOUS | Status: AC
  Administered 2018-12-10: 11:00:00 1000 mL via INTRAVENOUS

## 2018-12-10 MED ORDER — LACTATED RINGERS IV BOLUS
1000.0000 mL | Freq: Once | INTRAVENOUS | Status: AC
  Administered 2018-12-10: 1000 mL via INTRAVENOUS

## 2018-12-10 MED ORDER — INSULIN ASPART 100 UNIT/ML IV SOLN
10.0000 [IU] | Freq: Once | INTRAVENOUS | Status: AC
  Administered 2018-12-10: 10 [IU] via INTRAVENOUS
  Filled 2018-12-10: qty 0.1

## 2018-12-10 MED ORDER — DEXTROSE-NACL 5-0.45 % IV SOLN
INTRAVENOUS | Status: DC
  Administered 2018-12-11: 02:00:00 via INTRAVENOUS

## 2018-12-10 MED ORDER — POLYETHYLENE GLYCOL 3350 17 G PO PACK
17.0000 g | PACK | Freq: Every day | ORAL | Status: DC
  Administered 2018-12-11 – 2018-12-14 (×4): 17 g via ORAL
  Filled 2018-12-10 (×4): qty 1

## 2018-12-10 MED ORDER — POTASSIUM CHLORIDE 10 MEQ/100ML IV SOLN
10.0000 meq | INTRAVENOUS | Status: AC
  Administered 2018-12-10 (×2): 10 meq via INTRAVENOUS
  Filled 2018-12-10 (×2): qty 100

## 2018-12-10 MED ORDER — LACTATED RINGERS IV SOLN: INTRAVENOUS | Status: DC

## 2018-12-10 MED ORDER — SODIUM CHLORIDE 0.9 % IV SOLN
1.0000 g | Freq: Once | INTRAVENOUS | Status: AC
  Administered 2018-12-10: 1 g via INTRAVENOUS
  Filled 2018-12-10: qty 1

## 2018-12-10 MED ORDER — VANCOMYCIN HCL 10 G IV SOLR
1250.0000 mg | Freq: Once | INTRAVENOUS | Status: AC
  Administered 2018-12-10: 1250 mg via INTRAVENOUS
  Filled 2018-12-10: qty 1250

## 2018-12-10 MED ORDER — ACETAMINOPHEN 650 MG RE SUPP: 650.0000 mg | Freq: Four times a day (QID) | RECTAL | Status: DC | PRN

## 2018-12-10 MED ORDER — HEPARIN SODIUM (PORCINE) 5000 UNIT/ML IJ SOLN
5000.0000 [IU] | Freq: Three times a day (TID) | INTRAMUSCULAR | Status: DC
  Administered 2018-12-10 – 2018-12-14 (×9): 5000 [IU] via SUBCUTANEOUS
  Filled 2018-12-10 (×9): qty 1

## 2018-12-10 MED ORDER — LACTATED RINGERS IV BOLUS
2000.0000 mL | Freq: Once | INTRAVENOUS | Status: AC
  Administered 2018-12-10: 2000 mL via INTRAVENOUS

## 2018-12-10 MED ORDER — DEXTROSE-NACL 5-0.45 % IV SOLN: INTRAVENOUS | Status: DC

## 2018-12-10 MED ORDER — INSULIN REGULAR(HUMAN) IN NACL 100-0.9 UT/100ML-% IV SOLN
INTRAVENOUS | Status: DC
  Administered 2018-12-10 (×2): 5.4 [IU]/h via INTRAVENOUS
  Filled 2018-12-10: qty 100

## 2018-12-10 MED ORDER — LACTATED RINGERS IV SOLN
INTRAVENOUS | Status: DC
  Administered 2018-12-10: 21:00:00 via INTRAVENOUS

## 2018-12-10 MED ORDER — INSULIN REGULAR(HUMAN) IN NACL 100-0.9 UT/100ML-% IV SOLN
INTRAVENOUS | Status: DC
  Filled 2018-12-10: qty 100

## 2018-12-10 MED ORDER — ACETAMINOPHEN 325 MG PO TABS: 650.0000 mg | ORAL_TABLET | Freq: Four times a day (QID) | ORAL | Status: DC | PRN

## 2018-12-10 NOTE — ED Notes (Addendum)
Glucose stabilizer changed due to BS>600 x 3-multiplier changed to 0.01 per stabilizer order

## 2018-12-10 NOTE — ED Notes (Signed)
Crash cart placed at bedside

## 2018-12-10 NOTE — Telephone Encounter (Signed)
Critical lab notification from main lab, blood sugar is 1139. Notified Dr. Lanny Cramp, I called and spoke to patient and partner on the phone about results, partner is calling the ambulance to take pt to the ER.

## 2018-12-10 NOTE — ED Triage Notes (Signed)
Patient presents to Urgent Care with complaints of weakness and dizziness since 3 weeks ago. Patient reports he has also had a sore and dry throat.  Pt denies sick contacts, pt would like COVID testing today.

## 2018-12-10 NOTE — ED Notes (Signed)
Glucostabilizer restarted due to multiplier changed to 0.01

## 2018-12-10 NOTE — ED Notes (Signed)
PT has elected to drive himself home. This RN advised against driving and offered him a voucher for a free ride home and to call his family for someone to pick up his car. PT refused. PT reports he drove himself here and feels well enough to drive home. PT is A&O x4.

## 2018-12-10 NOTE — ED Notes (Signed)
EDP Kohut made aware of pt rectal temp, pt placed on bear hugger

## 2018-12-10 NOTE — ED Notes (Signed)
Date and time results received: 12/10/18 3:09 PM  (use smartphrase ".now" to insert current time)  Test: glucose  Critical Value: 1270  Name of Provider Notified: Caryl Pina RN   Orders Received? Or Actions Taken?:

## 2018-12-10 NOTE — ED Provider Notes (Signed)
[facility]    CSN: [id number] Arrival date & time: 12/10/18  [id number]      History   Chief Complaint Chief Complaint  Patient presents with  . Weakness  . Dizziness    HPI Modena MorrowWillie L Primiano is a 68 y.o. male with a history of hypertension-on antihypertensive agents comes to urgent care with a 3-week history of progressive weakness and dizziness.  Patient says symptoms started insidiously and is gotten progressively worse over the past 3 weeks.  He admits to feeling weak and dizzy.  He denies any syncopal episodes.  He denies any cough, fever, chills, shortness of breath, diarrhea, nausea vomiting.  His oral intake has been poor.  He is on antihypertensive agents and he continues to take them albeit inconsistently.   No shortness of breath, chest pain or chest pressure.  No sick contacts.  Patient has lost about 4 pounds over the past few weeks.  No change in his bowel movements.  HPI  Past Medical History:  Diagnosis Date  . High cholesterol   . Hypertension   . Stroke [facility](HCC)     Patient Active Problem List   Diagnosis Date Noted  . TIA (transient ischemic attack) 02/12/2014  . Thalamic infarct, right 02/12/2014  . Uncontrolled hypertension 02/12/2014    History reviewed. No pertinent surgical history.     Home Medications    Prior to Admission medications   Medication Sig Start Date End Date Taking? Authorizing Provider  amLODipine (NORVASC) 10 MG tablet Take 1 tablet (10 mg total) by mouth daily. 06/12/17  Yes Eber HongMiller, Brian, MD  aspirin 325 MG tablet Take 1 tablet (325 mg total) by mouth daily. 02/13/14  Yes Regalado, Belkys A, MD  valsartan (DIOVAN) 160 MG tablet TK 1 T PO QD 10/21/18  Yes [provider]  famotidine (PEPCID) 20 MG tablet Take 1 tablet (20 mg total) by mouth 2 (two) times daily. 06/12/17   Eber HongMiller, Brian, MD  simvastatin (ZOCOR) 20 MG tablet Take 1 tablet (20 mg total) by mouth daily at 6 PM. 02/13/14   Regalado, Prentiss BellsBelkys A, MD    Family  History Family History  Problem Relation Age of Onset  . Stroke Mother   . Stroke Father     Social History Social History   Tobacco Use  . Smoking status: Never Smoker  . Smokeless tobacco: Current User    Types: Chew  Substance Use Topics  . Alcohol use: No  . Drug use: No     Allergies   Patient has no known allergies.   Review of Systems Review of Systems  Constitutional: Positive for activity change, appetite change and fatigue. Negative for chills, diaphoresis, fever and unexpected weight change.  HENT: Negative for postnasal drip, rhinorrhea, sore throat and trouble swallowing.   Respiratory: Negative.   Cardiovascular: Negative.   Gastrointestinal: Negative for abdominal distention, diarrhea, nausea and vomiting.  Endocrine: Negative.  Negative for polydipsia, polyphagia and polyuria.  Genitourinary: Negative for dysuria, flank pain, frequency and urgency.  Musculoskeletal: Negative.   Skin: Negative.   Allergic/Immunologic: Negative.   Neurological: Positive for dizziness, weakness and light-headedness. Negative for syncope, speech difficulty, numbness and headaches.  Psychiatric/Behavioral: Negative for confusion and decreased concentration.  All other systems reviewed and are negative.    Physical Exam Triage Vital Signs ED Triage Vitals  Enc Vitals Group     BP 12/10/18 0944 (!) 72/53     Pulse Rate 12/10/18 0944 (!) 115     Resp  12/10/18 0944 17     Temp 12/10/18 0944 (!) 97.4 F (36.3 C)     Temp Source 12/10/18 0944 Oral     SpO2 12/10/18 0944 98 %     Weight --      Height --      Head Circumference --      Peak Flow --      Pain Score 12/10/18 0940 0     Pain Loc --      Pain Edu? --      Excl. in [place]? --    No data found.  Updated Vital Signs BP (!) 72/53 (BP Location: Left Arm)   Pulse (!) 115   Temp (!) 97.4 F (36.3 C) (Oral)   Resp 17   SpO2 98%   Visual Acuity Right Eye Distance:   Left Eye Distance:   Bilateral  Distance:    Right Eye Near:   Left Eye Near:    Bilateral Near:     Physical Exam Vitals signs and nursing note reviewed.  Constitutional:      General: He is not in acute distress.    Appearance: Normal appearance. He is ill-appearing. He is not toxic-appearing.  HENT:     Mouth/Throat:     Pharynx: No posterior oropharyngeal erythema.  Eyes:     Extraocular Movements: Extraocular movements intact.     Conjunctiva/sclera: Conjunctivae normal.  Neck:     Musculoskeletal: Normal range of motion. No neck rigidity.  Cardiovascular:     Rate and Rhythm: Regular rhythm. Tachycardia present.     Pulses: Normal pulses.     Heart sounds: No murmur. No friction rub.  Pulmonary:     Effort: Pulmonary effort is normal. No respiratory distress.     Breath sounds: No wheezing, rhonchi or rales.  Abdominal:     General: Abdomen is flat. There is no distension.     Tenderness: There is no abdominal tenderness. There is no guarding or rebound.  Musculoskeletal: Normal range of motion.        General: No swelling or deformity.  Lymphadenopathy:     Cervical: No cervical adenopathy.  Skin:    General: Skin is dry.     Capillary Refill: Capillary refill takes less than 2 seconds.     Coloration: Skin is not jaundiced.     Findings: No bruising, erythema or lesion.  Neurological:     General: No focal deficit present.     Mental Status: He is alert and oriented to person, place, and time.     Cranial Nerves: No cranial nerve deficit.     Sensory: No sensory deficit.     Motor: No weakness.      UC Treatments / Results  Labs (all labs ordered are listed, but only abnormal results are displayed) Labs Reviewed  NOVEL CORONAVIRUS, NAA (HOSPITAL ORDER, SEND-OUT TO REF LAB)  CBC  COMPREHENSIVE METABOLIC PANEL    EKG   Radiology No results found.  Procedures Procedures (including critical care time)  Medications Ordered in UC Medications  sodium chloride 0.9 % bolus 1,000 mL  (has no administration in time range)    Initial Impression / Assessment and Plan / UC Course  I have reviewed the triage vital signs and the nursing notes.  Pertinent labs & imaging results that were available during my care of the patient were reviewed by me and considered in my medical decision making (see chart for details).     1.  Acute renal failure: This is likely secondary to dehydration- polyuria Creatinine is 4.1 Previous creatinine was normal 1 L normal saline bolus given  2.  Hypovolemic shock: Normal saline given Repeat vitals show blood pressure of 96/60 with a heart rate of 107 Patient was advised several times to go to emergency department.  He refused and wanted to be cared for in the urgent care setting.  After IV fluids were completed I encouraged the patient is again to go to the emergency department and he refused that.  He wanted to go home.  Advised him to stop taking his antihypertensive medications given his low blood pressure.  3.  History of hypertension: Discontinue amlodipine and valsartan use  4.  Newly diagnosed diabetes mellitus type 2 with hyperosmolar hyperglycemic state: Patient refused to go to the emergency room initially.  Once we got the comprehensive metabolic panel back, we called the patient in the presence of his partner and the agreed to go to the emergency room.  They are going to call the ambulance to pick the patient to the emergency department.  Patient was driving himself home when we called him with the lab results. Final Clinical Impressions(s) / UC Diagnoses   Final diagnoses:  None   Discharge Instructions   None    ED Prescriptions    None     Controlled Substance Prescriptions [place] Controlled Substance Registry consulted? No   Merrilee JanskyLamptey, Philip O, MD 12/10/18 1320

## 2018-12-10 NOTE — ED Notes (Signed)
Called pharmacy for calcium gluconate. Should be ready in pyxis shortly to pull. Caryl Pina, RN made aware.

## 2018-12-10 NOTE — H&P (Signed)
History and Physical    Edward Morrow WUJ:[id number] DOB: 08/13/1950 DOA: 12/10/2018  PCP: Edward NobleGates, Robert, MD Patient coming from: home  I [facility] personally briefly [facility]  Chief Complaint: general malaise  HPI: Edward MorrowWillie L Morrow is Edward Morrow 68 y.o. male with medical history significant of diabetes, HTN, CVA, HLD presenting with about 3 weeks of generalized weakness.  Pt notes his sx started about 3 weeks ago.  He states he's just felt progressively weak and dizzy.  No episodes of syncope.  He notes his sx have gotten worse.  No fever, chills, cough, cp, sob, nausea, vomiting, abdominal pain.  No sick contacts.  He's lost about 4 lbs over this period of time.  He's had decreased appetite, but increased thirst and UOP.  He denies Noura Purpura hx of diabetes, but it looks like he had a1c of 6.8 in 2015 based on chart review.  ED Course: In ED he received EKG, IVF, calcium gluconate, bicarb, insulin gtt.  Hospitalist called to admit.   Review of Systems: As per HPI otherwise 10 point review of systems negative.   Past Medical History:  Diagnosis Date  . High cholesterol   . Hypertension   . Stroke [facility](HCC)     No past surgical history on file.   reports that he has never smoked. His smokeless tobacco use includes chew. He reports that he does not drink alcohol or use drugs.  No Known Allergies  Family History  Problem Relation Age of Onset  . Stroke Mother   . Stroke Father     Prior to Admission medications   Medication Sig Start Date End Date Taking? Authorizing Provider  amLODipine (NORVASC) 5 MG tablet Take 5 mg by mouth daily. 10/08/18  Yes [provider]  aspirin 325 MG tablet Take 1 tablet (325 mg total) by mouth daily. Patient not taking: Reported on 12/10/2018 02/13/14   Regalado, Edward BillingsBelkys Dow Blahnik, MD  famotidine (PEPCID) 20 MG tablet Take 1 tablet (20 mg total) by mouth 2 (two) times daily. 06/12/17 12/10/18  Edward HongMiller, Brian, MD  simvastatin  (ZOCOR) 20 MG tablet Take 1 tablet (20 mg total) by mouth daily at 6 PM. 02/13/14 12/10/18  Regalado, Edward BillingsBelkys Antinette Keough, MD  valsartan (DIOVAN) 160 MG tablet TK 1 T PO QD 10/21/18 12/10/18  [provider]    Physical Exam: Vitals:   12/10/18 1533 12/10/18 1534 12/10/18 1535 12/10/18 1600  BP: (!) 132/92   (!) 97/52  Pulse: (!) 101  (!) 103 91  Resp: (!) 22   16  Temp:  (!) 93.8 F (34.3 C)    TempSrc:  Rectal    SpO2: 100%  100% 100%  Weight:      Height:        Constitutional: NAD, calm, comfortable Vitals:   12/10/18 1533 12/10/18 1534 12/10/18 1535 12/10/18 1600  BP: (!) 132/92   (!) 97/52  Pulse: (!) 101  (!) 103 91  Resp: (!) 22   16  Temp:  (!) 93.8 F (34.3 C)    TempSrc:  Rectal    SpO2: 100%  100% 100%  Weight:      Height:       Eyes: PERRL, lids and conjunctivae normal ENMT: Mucous membranes are moist. Posterior pharynx clear of any exudate or lesions.Normal dentition.  Neck: normal, supple, no masses, no thyromegaly Respiratory: clear to auscultation bilaterally, no wheezing, no crackles. Normal respiratory effort. No accessory muscle use.  Cardiovascular: Regular  rate and rhythm, no murmurs / rubs / gallops. No extremity edema. 1+ pedal pulses. No carotid bruits.  Abdomen: no tenderness, no masses palpated Musculoskeletal: no clubbing / cyanosis. No joint deformity upper and lower extremities. Good ROM, no contractures. Normal muscle tone.  Skin: no rashes, lesions, ulcers. No induration Neurologic: CN 2-12 grossly intact. Moving all extremities.  Psychiatric: Normal judgment and insight. Alert and oriented x 3 (said urgent care first, but he came from urgent care). Normal mood.   Labs on Admission: I have personally reviewed following labs and imaging studies  CBC: Recent Labs  Lab 12/10/18 1123 12/10/18 1413  WBC 13.4* 18.2*  NEUTROABS 11.3* 15.3*  HGB 12.5* 12.6*  HCT 43.3 43.7  MCV 76.0* 77.3*  PLT 233 440   Basic Metabolic Panel: Recent Labs   Lab 12/10/18 1123 12/10/18 1413  NA 142 140  K 6.0* 6.2*  CL 104 101  CO2 10* 8*  GLUCOSE 1,139* 1,270*  BUN 72* 80*  CREATININE 4.11* 4.06*  CALCIUM 9.4 9.2   GFR: Estimated Creatinine Clearance: 14.2 mL/min (Demarrion Meiklejohn) (by C-G formula based on SCr of 4.06 mg/dL (H)). Liver Function Tests: Recent Labs  Lab 12/10/18 1123  AST 12*  ALT 23  ALKPHOS 88  BILITOT 1.5*  PROT 7.1  ALBUMIN 3.5   No results for input(s): LIPASE, AMYLASE in the last 168 hours. No results for input(s): AMMONIA in the last 168 hours. Coagulation Profile: No results for input(s): INR, PROTIME in the last 168 hours. Cardiac Enzymes: No results for input(s): CKTOTAL, CKMB, CKMBINDEX, TROPONINI in the last 168 hours. BNP (last 3 results) No results for input(s): PROBNP in the last 8760 hours. HbA1C: No results for input(s): HGBA1C in the last 72 hours. CBG: Recent Labs  Lab 12/10/18 1348 12/10/18 1611 12/10/18 1723  GLUCAP >600* >600* >600*   Lipid Profile: No results for input(s): CHOL, HDL, LDLCALC, TRIG, CHOLHDL, LDLDIRECT in the last 72 hours. Thyroid Function Tests: No results for input(s): TSH, T4TOTAL, FREET4, T3FREE, THYROIDAB in the last 72 hours. Anemia Panel: No results for input(s): VITAMINB12, FOLATE, FERRITIN, TIBC, IRON, RETICCTPCT in the last 72 hours. Urine analysis: No results found for: COLORURINE, APPEARANCEUR, LABSPEC, PHURINE, GLUCOSEU, HGBUR, BILIRUBINUR, KETONESUR, PROTEINUR, UROBILINOGEN, NITRITE, LEUKOCYTESUR  Radiological Exams on Admission: No results found.  EKG: Independently reviewed. Sinus rhythm with RBBB and LAFB.  Peaked T waves suggestive of hyperkalemia.  Prolonged QTc.  Assessment/Plan Active Problems:   DKA (diabetic ketoacidoses) ([place])  DKA  HHS  Diabetes: Denies hx of DM, but had A1c of 6.8 in 2015.  BG at presentation over 1200 and calc osms 379.  AG is 31 with elevated betahydroxybutyric acid as well. S/p 2 L bolus LR in ED with additional bolus  ordered Will continue with NS once this is complete to transition to D5 1/2 NS based on protocol Insulin gtt per protocol Serial BMP Serum osm Inciting cause unclear, but elevated WBC count and hypothermia, start broad spectrum abx Diabetes educator    Acute Kidney Injury: baseline in 2019 was 1.04.  Peaked to 4.11 here today.  Likely 2/2 dehydration from above.  Follow with IVF.  Follow UA and renal US Strict I/O, daily weights  Hyperkalemia: with EKG changes noted, s/p calcium gluconate, bicarb, and insulin.  Expect this should improve with treatment of DKA above and improvement in renal function. Follow serial K and EKG's  Anion Gap Metabolic Acidosis  Lactic Acidosis: 2/2 above, follow with IVF.  PH 7.16.  Will  continue to monitor with insulin and IVF.  Systemic Inflammatory Response Syndrome  Hypothermia: no clear source of infection, but with hypothermia, tachycardia, elevated WBC and soft BP will start vancomycin and cefepime. Follow blood and urine cultures.  Follow UA Follow CXR COVID 19 test pending Bare hugger for hypothermia  Nonsustained Vtach: occurred on tele while I was at bedside.  EF unknown.  Will follow echo tomorrow, continue to monitor on telemetry.  Prolonged QTc: follow repeat EKG in AM.  Follow mag.  Hypertension: pt not sure of meds, will hold amlodipine on med list for now in setting of possible infection  HLD: pt unclear what meds he's taking, will discuss with family  Hx CVA: per chart hx, pt on ASA per chart and also notes he's taking cholesterol medication.  Will discuss further with pt and family  Will need to discuss meds further with family for collateral  DVT prophylaxis: heparin  Code Status: full (confirmed with pt)  Family Communication: none at bedside - discussed with son over phone Disposition Plan: pending further improvement in renal function  Consults called: none  Admission status: stepdown - 50 min critical care time with pt  with AGMA with hyperkalemia with EKG changes and severe DKA with possible infection     Lacretia Nicksaldwell Powell MD [facility] Pager AMION  If 7PM-7AM, please contact night-coverage [email or web address] Password [email or web address]  12/10/2018, 5:48 PM

## 2018-12-10 NOTE — ED Notes (Signed)
Date and time results received: 12/10/18  (use smartphrase ".now" to insert current time)  Test: Glucosw Critical Value: [place]  Name of Provider Notified: Wilson Singer

## 2018-12-10 NOTE — Plan of Care (Signed)
  Problem: Clinical Measurements: Goal: Diagnostic test results will improve Outcome: Progressing   

## 2018-12-10 NOTE — ED Notes (Signed)
US at bedside

## 2018-12-10 NOTE — ED Notes (Signed)
Date and time results received: 12/10/18  (use smartphrase ".now" to insert current time)  Test: Lactic  Critical Value: 2.2  Name of Provider Notified: Wilson Singer

## 2018-12-10 NOTE — ED Notes (Signed)
Date and time results received: 12/10/18  (use smartphrase ".now" to insert current time)  Test: glucose Critical Value: 903  Name of Provider Notified: Wilson Singer

## 2018-12-10 NOTE — Progress Notes (Signed)
CRITICAL VALUE ALERT  Critical Value:  Glucose 903  Date & Time Notied:  1914      12/10/18  Provider Notified: Roel Cluck, MD  Orders Received/Actions taken: none at this time

## 2018-12-10 NOTE — ED Notes (Signed)
Date and time results received: 12/10/18  (use smartphrase ".now" to insert current time)  Test: Glucose  Critical Value: 1270  Name of Provider Notified: Wilson Singer

## 2018-12-10 NOTE — Progress Notes (Signed)
CRITICAL VALUE ALERT  Critical Value: Glucose 1055  Date & Time Notied:  6394    12/10/18  Provider Notified: C.Powel MD  Orders Received/Actions taken: none at this time

## 2018-12-10 NOTE — ED Notes (Signed)
hospitalist at bedside

## 2018-12-10 NOTE — ED Triage Notes (Signed)
Pt to ED via EMS from home. Pt went to urgent care today d/t not feeling well/tired. Pt went home from urgent care and then received call stating his glucose was > 1000 and that  He needed to come to ED for further evaluation. Pt is not a dx DM, told in the past he was "borderline" Alert and oriented x 4. Pt not voicing any complaints to EMS. Pt ambulatory.

## 2018-12-10 NOTE — ED Provider Notes (Signed)
[facility]-EMERGENCY DEPT Provider Note   CSN: [id number] Arrival date & time: 12/10/18  1330     History   Chief Complaint Chief Complaint  Patient presents with  . Hyperglycemia    HPI Edward Morrow is a 68 y.o. male.     HPI   68 year old male with generalized weakness.  Onset about 3 weeks ago.  Constant and progressive.  Today he went to urgent care for evaluation and was noted to be significantly hyperglycemic and advised to come the emergency room.  He does endorse polyuria and polydipsia.  He has not had much of an appetite.  He feels like he has lost a few pounds since onset of symptoms but does not weigh himself regularly.  No fevers.  No cough or shortness of breath.  No previous diagnosis of diabetes.  Past Medical History:  Diagnosis Date  . High cholesterol   . Hypertension   . Stroke [facility](HCC)     Patient Active Problem List   Diagnosis Date Noted  . TIA (transient ischemic attack) 02/12/2014  . Thalamic infarct, right 02/12/2014  . Uncontrolled hypertension 02/12/2014    No past surgical history on file.      Home Medications    Prior to Admission medications   Medication Sig Start Date End Date Taking? Authorizing Provider  amLODipine (NORVASC) 5 MG tablet Take 5 mg by mouth daily. 10/08/18  Yes [provider]  aspirin 325 MG tablet Take 1 tablet (325 mg total) by mouth daily. 02/13/14   Regalado, Belkys A, MD  famotidine (PEPCID) 20 MG tablet Take 1 tablet (20 mg total) by mouth 2 (two) times daily. 06/12/17 12/10/18  Eber HongMiller, Brian, MD  simvastatin (ZOCOR) 20 MG tablet Take 1 tablet (20 mg total) by mouth daily at 6 PM. 02/13/14 12/10/18  Regalado, Jon BillingsBelkys A, MD  valsartan (DIOVAN) 160 MG tablet TK 1 T PO QD 10/21/18 12/10/18  [provider]    Family History Family History  Problem Relation Age of Onset  . Stroke Mother   . Stroke Father     Social History Social History   Tobacco Use  . Smoking status:  Never Smoker  . Smokeless tobacco: Current User    Types: Chew  Substance Use Topics  . Alcohol use: No  . Drug use: No     Allergies   Patient has no known allergies.   Review of Systems Review of Systems  All systems reviewed and negative, other than as noted in HPI.  Physical Exam Updated Vital Signs BP (!) 87/49   Pulse (!) 106   Resp 18   Ht 5\' 4"  (1.626 m)   Wt 63.5 kg   SpO2 100%   BMI 24.03 kg/m   Physical Exam Vitals signs and nursing note reviewed.  Constitutional:      Appearance: He is well-developed.  HENT:     Head: Normocephalic and atraumatic.     Mouth/Throat:     Mouth: Mucous membranes are dry.  Eyes:     General:        Right eye: No discharge.        Left eye: No discharge.     Conjunctiva/sclera: Conjunctivae normal.  Neck:     Musculoskeletal: Neck supple.  Cardiovascular:     Rate and Rhythm: Regular rhythm. Tachycardia present.     Heart sounds: Normal heart sounds. No murmur. No friction rub. No gallop.   Pulmonary:     Effort: Pulmonary  effort is normal. No respiratory distress.     Breath sounds: Normal breath sounds.  Abdominal:     General: There is no distension.     Palpations: Abdomen is soft.     Tenderness: There is no abdominal tenderness.  Musculoskeletal:        General: No tenderness.  Skin:    General: Skin is warm and dry.  Neurological:     Mental Status: He is alert.  Psychiatric:        Behavior: Behavior normal.        Thought Content: Thought content normal.      ED Treatments / Results  Labs (all labs ordered are listed, but only abnormal results are displayed) Labs Reviewed  CBC WITH DIFFERENTIAL/PLATELET - Abnormal; Notable for the following components:      Result Value   WBC 18.2 (*)    Hemoglobin 12.6 (*)    MCV 77.3 (*)    MCH 22.3 (*)    MCHC 28.8 (*)    RDW 18.2 (*)    Neutro Abs 15.3 (*)    Monocytes Absolute 1.7 (*)    Abs Immature Granulocytes 0.16 (*)    All other components  within normal limits  BASIC METABOLIC PANEL - Abnormal; Notable for the following components:   Potassium 6.2 (*)    CO2 8 (*)    Glucose, Bld 1,270 (*)    BUN 80 (*)    Creatinine, Ser 4.06 (*)    GFR calc non Af Amer 14 (*)    GFR calc Af Amer 16 (*)    Anion gap 31 (*)    All other components within normal limits  URINALYSIS, ROUTINE W REFLEX MICROSCOPIC - Abnormal; Notable for the following components:   Color, Urine STRAW (*)    APPearance HAZY (*)    Glucose, UA >=500 (*)    Hgb urine dipstick MODERATE (*)    Ketones, ur 20 (*)    Bacteria, UA RARE (*)    All other components within normal limits  BETA-HYDROXYBUTYRIC ACID - Abnormal; Notable for the following components:   Beta-Hydroxybutyric Acid >8.00 (*)    All other components within normal limits  BLOOD GAS, ARTERIAL - Abnormal; Notable for the following components:   pH, Arterial 7.160 (*)    Bicarbonate 11.6 (*)    Acid-base deficit 16.0 (*)    All other components within normal limits  LACTIC ACID, PLASMA - Abnormal; Notable for the following components:   Lactic Acid, Venous 2.5 (*)    All other components within normal limits  LACTIC ACID, PLASMA - Abnormal; Notable for the following components:   Lactic Acid, Venous 2.2 (*)    All other components within normal limits  BASIC METABOLIC PANEL - Abnormal; Notable for the following components:   CO2 13 (*)    Glucose, Bld 1,055 (*)    BUN 81 (*)    Creatinine, Ser 3.88 (*)    GFR calc non Af Amer 15 (*)    GFR calc Af Amer 17 (*)    Anion gap 26 (*)    All other components within normal limits  BASIC METABOLIC PANEL - Abnormal; Notable for the following components:   Sodium 146 (*)    CO2 15 (*)    Glucose, Bld 903 (*)    BUN 74 (*)    Creatinine, Ser 3.64 (*)    GFR calc non Af Amer 16 (*)    GFR calc Af Amer 18 (*)  Anion gap 23 (*)    All other components within normal limits  BASIC METABOLIC PANEL - Abnormal; Notable for the following components:    Sodium 152 (*)    Chloride 113 (*)    Glucose, Bld 510 (*)    BUN 66 (*)    Creatinine, Ser 2.98 (*)    GFR calc non Af Amer 20 (*)    GFR calc Af Amer 24 (*)    Anion gap 16 (*)    All other components within normal limits  OSMOLALITY - Abnormal; Notable for the following components:   Osmolality 418 (*)    All other components within normal limits  GLUCOSE, CAPILLARY - Abnormal; Notable for the following components:   Glucose-Capillary 505 (*)    All other components within normal limits  BASIC METABOLIC PANEL - Abnormal; Notable for the following components:   Sodium 158 (*)    Chloride 119 (*)    Glucose, Bld 196 (*)    BUN 51 (*)    Creatinine, Ser 1.93 (*)    GFR calc non Af Amer 34 (*)    GFR calc Af Amer 40 (*)    Anion gap 16 (*)    All other components within normal limits  GLUCOSE, CAPILLARY - Abnormal; Notable for the following components:   Glucose-Capillary 461 (*)    All other components within normal limits  GLUCOSE, CAPILLARY - Abnormal; Notable for the following components:   Glucose-Capillary 343 (*)    All other components within normal limits  GLUCOSE, CAPILLARY - Abnormal; Notable for the following components:   Glucose-Capillary 305 (*)    All other components within normal limits  GLUCOSE, CAPILLARY - Abnormal; Notable for the following components:   Glucose-Capillary 217 (*)    All other components within normal limits  GLUCOSE, CAPILLARY - Abnormal; Notable for the following components:   Glucose-Capillary 161 (*)    All other components within normal limits  GLUCOSE, CAPILLARY - Abnormal; Notable for the following components:   Glucose-Capillary 123 (*)    All other components within normal limits  CBC - Abnormal; Notable for the following components:   WBC 17.0 (*)    Hemoglobin 11.6 (*)    MCV 72.9 (*)    MCH 21.5 (*)    MCHC 29.5 (*)    RDW 15.9 (*)    All other components within normal limits  COMPREHENSIVE METABOLIC PANEL -  Abnormal; Notable for the following components:   Sodium 158 (*)    Chloride 122 (*)    Glucose, Bld 155 (*)    BUN 55 (*)    Creatinine, Ser 2.21 (*)    Total Bilirubin 0.2 (*)    GFR calc non Af Amer 29 (*)    GFR calc Af Amer 34 (*)    All other components within normal limits  GLUCOSE, CAPILLARY - Abnormal; Notable for the following components:   Glucose-Capillary 123 (*)    All other components within normal limits  GLUCOSE, CAPILLARY - Abnormal; Notable for the following components:   Glucose-Capillary 119 (*)    All other components within normal limits  CBG MONITORING, ED - Abnormal; Notable for the following components:   Glucose-Capillary >600 (*)    All other components within normal limits  CBG MONITORING, ED - Abnormal; Notable for the following components:   Glucose-Capillary >600 (*)    All other components within normal limits  CBG MONITORING, ED - Abnormal; Notable for the following components:  Glucose-Capillary >600 (*)    All other components within normal limits  CBG MONITORING, ED - Abnormal; Notable for the following components:   Glucose-Capillary >600 (*)    All other components within normal limits  CBG MONITORING, ED - Abnormal; Notable for the following components:   Glucose-Capillary >600 (*)    All other components within normal limits  SARS CORONAVIRUS 2 (HOSPITAL ORDER, PERFORMED IN [facility])  CULTURE, BLOOD (ROUTINE X 2)  CULTURE, BLOOD (ROUTINE X 2)  URINE CULTURE  CK  HEMOGLOBIN A1C  HIV ANTIBODY (ROUTINE TESTING W REFLEX)  BASIC METABOLIC PANEL  BASIC METABOLIC PANEL  BASIC METABOLIC PANEL  HEMOGLOBIN A1C  LIPASE, BLOOD  TROPONIN I (HIGH SENSITIVITY)  TROPONIN I (HIGH SENSITIVITY)    EKG EKG Interpretation  Date/Time:  Friday December 10 2018 14:29:17 EDT Ventricular Rate:  99 PR Interval:    QRS Duration: 158 QT Interval:  407 QTC Calculation: 523 R Axis:   -97 Text Interpretation:  Sinus rhythm RBBB and LAFB  Suggestive of hyperkalemia Confirmed by Raeford RazorKohut, Anastasio Wogan [phone]([id number]) on 12/10/2018 3:02:24 PM   Radiology [place] Renal  Result Date: 12/10/2018 CLINICAL DATA:  Acute kidney injury EXAM: RENAL / URINARY TRACT ULTRASOUND COMPLETE COMPARISON:  None FINDINGS: Right Kidney: Renal measurements: 9.0 x 5.0 x 5.7 cm = volume: 134 mL. 1.4 cm midpole cyst. Mildly increased echotexture. No hydronephrosis. Left Kidney: Renal measurements: 9.4 x 5.6 x 4.4 cm = volume: 121 mL. Upper pole cysts. Mildly increased echotexture. No hydronephrosis. Bladder: Appears normal for degree of bladder distention. IMPRESSION: Mildly increased echotexture in the kidneys bilaterally compatible with chronic medical renal disease. No acute findings. Electronically Signed   By: Charlett NoseKevin  Dover M.D.   On: 12/10/2018 18:18   Dg Chest Portable 1 View  Result Date: 12/10/2018 CLINICAL DATA:  New onset diabetes. EXAM: PORTABLE CHEST 1 VIEW COMPARISON:  None. FINDINGS: The heart size and mediastinal contours are within normal limits. Both lungs are clear. The visualized skeletal structures are unremarkable. IMPRESSION: No active disease. Electronically Signed   By: Elberta Fortisaniel  Boyle M.D.   On: 12/10/2018 17:51    Procedures Procedures (including critical care time)  CRITICAL CARE Performed by: Raeford RazorStephen Skylene Deremer Total critical care time: 50 minutes Critical care time was exclusive of separately billable procedures and treating other patients. Critical care was necessary to treat or prevent imminent or life-threatening deterioration. Critical care was time spent personally by me on the following activities: development of treatment plan with patient and/or surrogate as well as nursing, discussions with consultants, evaluation of patient's response to treatment, examination of patient, obtaining history from patient or surrogate, ordering and performing treatments and interventions, ordering and review of laboratory studies, ordering and review of radiographic  studies, pulse oximetry and re-evaluation of patient's condition.   Medications Ordered in ED Medications  insulin regular, human (MYXREDLIN) 100 units/ 100 mL infusion (10.8 Units/hr Intravenous Rate/Dose Change 12/10/18 1616)  lactated ringers infusion (has no administration in time range)  dextrose 5 %-0.45 % sodium chloride infusion (has no administration in time range)  lactated ringers bolus 1,000 mL (has no administration in time range)  lactated ringers bolus 2,000 mL (0 mLs Intravenous Stopped 12/10/18 1540)  lactated ringers bolus 1,000 mL (1,000 mLs Intravenous New Bag/Given 12/10/18 1611)  sodium bicarbonate injection 50 mEq (50 mEq Intravenous Given 12/10/18 1441)  insulin aspart (novoLOG) injection 10 Units (10 Units Intravenous Given 12/10/18 1502)  calcium gluconate 1 g/ 50 mL sodium chloride IVPB (0 g  Intravenous Stopped 12/10/18 1600)     Initial Impression / Assessment and Plan / ED Course  I have reviewed the triage vital signs and the nursing notes.  Pertinent labs & imaging results that were available during my care of the patient were reviewed by me and considered in my medical decision making (see chart for details).    68 year old male with market hyperglycemia.  Metabolic acidosis with increased anion gap.  New onset diabetes.  Hypotensive.  On exam though he looks tired but not toxic.  He is answering questions appropriately.  He denies any acute pain.  2 IVs established.  Initiated IV fluid boluses.  EKG noted. Is hyperkalemic. Ca, bicarb, insulin bolus given. Expect to further correct with insulin infusion and improvement of renal function.   Not clear if there is an acute driving precipitant of this.  He is afebrile.  He does have a leukocytosis but this is not surprising and could merely could be from stress demargination.  No clear source of possible infection based on symptoms or his exam.  He is not having chest pain.  Neuro exam is fine.  3:34 PM Discussed  with pt's son Claris GladdenWillie Circle Jr. [phone]) Pt and son are fine with calling for future updates about pt.   Discussed with Dr. Lowell GuitarPowell, admitting physician.  Requesting chest x-ray and empiric antibiotics. Not obviously infectious process but given his degree of illness, leukocytosis, hypothermia I think this is very reasonable and can be de-escalated if needed as clinical picture becomes more clear.   Final Clinical Impressions(s) / ED Diagnoses   Final diagnoses:  Diabetic ketoacidosis without coma associated with other specified diabetes mellitus (HCC)  Hyperkalemia  Acute renal failure, unspecified acute renal failure type [facility](HCC)    ED Discharge Orders    None       Raeford RazorKohut, Zadok Holaway, MD 12/11/18 1136

## 2018-12-10 NOTE — ED Notes (Addendum)
Date and time results received: 12/10/18 5:17 PM  Test: Lactic Critical Value: 2.5  Name of Provider Notified: Kohut  Orders Received? Or Actions Taken?: awaiting further orders

## 2018-12-10 NOTE — ED Notes (Signed)
Spoke with Lab, able to add ordered troponin to previously collected.

## 2018-12-10 NOTE — ED Notes (Signed)
Kohut MD aware of pt EKG, Kohut at bedside.

## 2018-12-11 ENCOUNTER — Inpatient Hospital Stay (HOSPITAL_COMMUNITY): Payer: Medicare Other

## 2018-12-11 ENCOUNTER — Encounter (HOSPITAL_COMMUNITY): Payer: Self-pay | Admitting: *Deleted

## 2018-12-11 DIAGNOSIS — I472 Ventricular tachycardia: Secondary | ICD-10-CM

## 2018-12-11 LAB — COMPREHENSIVE METABOLIC PANEL
ALT: 20 U/L (ref 0–44)
AST: 18 U/L (ref 15–41)
Albumin: 3.5 g/dL (ref 3.5–5.0)
Alkaline Phosphatase: 78 U/L (ref 38–126)
Anion gap: 8 (ref 5–15)
BUN: 55 mg/dL — ABNORMAL HIGH (ref 8–23)
CO2: 28 mmol/L (ref 22–32)
Calcium: 9.4 mg/dL (ref 8.9–10.3)
Chloride: 122 mmol/L — ABNORMAL HIGH (ref 98–111)
Creatinine, Ser: 2.21 mg/dL — ABNORMAL HIGH (ref 0.61–1.24)
GFR calc Af Amer: 34 mL/min — ABNORMAL LOW (ref >60)
GFR calc non Af Amer: 29 mL/min — ABNORMAL LOW (ref >60)
Glucose, Bld: 155 mg/dL — ABNORMAL HIGH (ref 70–99)
Potassium: 3.9 mmol/L (ref 3.5–5.1)
Sodium: 158 mmol/L — ABNORMAL HIGH (ref 135–145)
Total Bilirubin: 0.2 mg/dL — ABNORMAL LOW (ref 0.3–1.2)
Total Protein: 6.5 g/dL (ref 6.5–8.1)

## 2018-12-11 LAB — CBC
HCT: 39.3 % (ref 39.0–52.0)
Hemoglobin: 11.6 g/dL — ABNORMAL LOW (ref 13.0–17.0)
MCH: 21.5 pg — ABNORMAL LOW (ref 26.0–34.0)
MCHC: 29.5 g/dL — ABNORMAL LOW (ref 30.0–36.0)
MCV: 72.9 fL — ABNORMAL LOW (ref 80.0–100.0)
Platelets: 195 10*3/uL (ref 150–400)
RBC: 5.39 MIL/uL (ref 4.22–5.81)
RDW: 15.9 % — ABNORMAL HIGH (ref 11.5–15.5)
WBC: 17 10*3/uL — ABNORMAL HIGH (ref 4.0–10.5)
nRBC: 0 % (ref 0.0–0.2)

## 2018-12-11 LAB — BASIC METABOLIC PANEL
Anion gap: 16 — ABNORMAL HIGH (ref 5–15)
Anion gap: 10 (ref 5–15)
Anion gap: 12 (ref 5–15)
BUN: 51 mg/dL — ABNORMAL HIGH (ref 8–23)
BUN: 50 mg/dL — ABNORMAL HIGH (ref 8–23)
BUN: 41 mg/dL — ABNORMAL HIGH (ref 8–23)
CO2: 23 mmol/L (ref 22–32)
CO2: 26 mmol/L (ref 22–32)
CO2: 24 mmol/L (ref 22–32)
Calcium: 9.3 mg/dL (ref 8.9–10.3)
Calcium: 9.1 mg/dL (ref 8.9–10.3)
Calcium: 9.3 mg/dL (ref 8.9–10.3)
Chloride: 119 mmol/L — ABNORMAL HIGH (ref 98–111)
Chloride: 119 mmol/L — ABNORMAL HIGH (ref 98–111)
Chloride: 119 mmol/L — ABNORMAL HIGH (ref 98–111)
Creatinine, Ser: 1.93 mg/dL — ABNORMAL HIGH (ref 0.61–1.24)
Creatinine, Ser: 1.95 mg/dL — ABNORMAL HIGH (ref 0.61–1.24)
Creatinine, Ser: 1.63 mg/dL — ABNORMAL HIGH (ref 0.61–1.24)
GFR calc Af Amer: 40 mL/min — ABNORMAL LOW (ref >60)
GFR calc Af Amer: 39 mL/min — ABNORMAL LOW (ref >60)
GFR calc Af Amer: 49 mL/min — ABNORMAL LOW (ref >60)
GFR calc non Af Amer: 34 mL/min — ABNORMAL LOW (ref >60)
GFR calc non Af Amer: 34 mL/min — ABNORMAL LOW (ref >60)
GFR calc non Af Amer: 42 mL/min — ABNORMAL LOW (ref >60)
Glucose, Bld: 196 mg/dL — ABNORMAL HIGH (ref 70–99)
Glucose, Bld: 270 mg/dL — ABNORMAL HIGH (ref 70–99)
Glucose, Bld: 307 mg/dL — ABNORMAL HIGH (ref 70–99)
Potassium: 4.1 mmol/L (ref 3.5–5.1)
Potassium: 4.2 mmol/L (ref 3.5–5.1)
Potassium: 4.3 mmol/L (ref 3.5–5.1)
Sodium: 158 mmol/L — ABNORMAL HIGH (ref 135–145)
Sodium: 155 mmol/L — ABNORMAL HIGH (ref 135–145)
Sodium: 155 mmol/L — ABNORMAL HIGH (ref 135–145)

## 2018-12-11 LAB — HIV ANTIBODY (ROUTINE TESTING W REFLEX): HIV Screen 4th Generation wRfx: NONREACTIVE

## 2018-12-11 LAB — GLUCOSE, CAPILLARY
Glucose-Capillary: 119 mg/dL — ABNORMAL HIGH (ref 70–99)
Glucose-Capillary: 123 mg/dL — ABNORMAL HIGH (ref 70–99)
Glucose-Capillary: 123 mg/dL — ABNORMAL HIGH (ref 70–99)
Glucose-Capillary: 151 mg/dL — ABNORMAL HIGH (ref 70–99)
Glucose-Capillary: 161 mg/dL — ABNORMAL HIGH (ref 70–99)
Glucose-Capillary: 212 mg/dL — ABNORMAL HIGH (ref 70–99)
Glucose-Capillary: 217 mg/dL — ABNORMAL HIGH (ref 70–99)
Glucose-Capillary: 272 mg/dL — ABNORMAL HIGH (ref 70–99)
Glucose-Capillary: 292 mg/dL — ABNORMAL HIGH (ref 70–99)
Glucose-Capillary: 305 mg/dL — ABNORMAL HIGH (ref 70–99)
Glucose-Capillary: 343 mg/dL — ABNORMAL HIGH (ref 70–99)
Glucose-Capillary: 71 mg/dL (ref 70–99)

## 2018-12-11 LAB — LIPASE, BLOOD: Lipase: 974 U/L — ABNORMAL HIGH (ref 11–51)

## 2018-12-11 LAB — ECHOCARDIOGRAM COMPLETE
Height: 64 in
Weight: 2222.24 oz

## 2018-12-11 LAB — HEMOGLOBIN A1C
Hgb A1c MFr Bld: 16.8 % — ABNORMAL HIGH (ref 4.8–5.6)
Mean Plasma Glucose: 435.46 mg/dL

## 2018-12-11 LAB — OSMOLALITY: Osmolality: 418 mOsm/kg (ref 275–295)

## 2018-12-11 LAB — NOVEL CORONAVIRUS, NAA (HOSP ORDER, SEND-OUT TO REF LAB; TAT 18-24 HRS): SARS-CoV-2, NAA: NOT DETECTED

## 2018-12-11 LAB — TSH: TSH: 0.23 u[IU]/mL — ABNORMAL LOW (ref 0.350–4.500)

## 2018-12-11 LAB — TRIGLYCERIDES: Triglycerides: 65 mg/dL (ref <150)

## 2018-12-11 MED ORDER — DEXTROSE 5 % IV SOLN
INTRAVENOUS | Status: DC
  Administered 2018-12-11 – 2018-12-12 (×2): via INTRAVENOUS

## 2018-12-11 MED ORDER — INSULIN ASPART 100 UNIT/ML ~~LOC~~ SOLN
0.0000 [IU] | Freq: Three times a day (TID) | SUBCUTANEOUS | Status: DC
  Administered 2018-12-11: 8 [IU] via SUBCUTANEOUS

## 2018-12-11 MED ORDER — LACTATED RINGERS IV SOLN
INTRAVENOUS | Status: AC
  Administered 2018-12-11 – 2018-12-12 (×2): via INTRAVENOUS

## 2018-12-11 MED ORDER — LIVING WELL WITH DIABETES BOOK
Freq: Once | Status: AC
  Administered 2018-12-11: 09:00:00
  Filled 2018-12-11: qty 1

## 2018-12-11 MED ORDER — CHLORHEXIDINE GLUCONATE CLOTH 2 % EX PADS: 6.0000 | MEDICATED_PAD | Freq: Every day | CUTANEOUS | Status: DC

## 2018-12-11 MED ORDER — LACTATED RINGERS IV SOLN
INTRAVENOUS | Status: DC
  Administered 2018-12-11: 07:00:00 via INTRAVENOUS

## 2018-12-11 MED ORDER — INSULIN ASPART 100 UNIT/ML ~~LOC~~ SOLN
0.0000 [IU] | SUBCUTANEOUS | Status: DC
  Administered 2018-12-11: 3 [IU] via SUBCUTANEOUS

## 2018-12-11 MED ORDER — INSULIN GLARGINE 100 UNIT/ML ~~LOC~~ SOLN
10.0000 [IU] | Freq: Once | SUBCUTANEOUS | Status: AC
  Administered 2018-12-11: 10 [IU] via SUBCUTANEOUS
  Filled 2018-12-11: qty 0.1

## 2018-12-11 MED ORDER — INSULIN ASPART 100 UNIT/ML ~~LOC~~ SOLN
3.0000 [IU] | Freq: Three times a day (TID) | SUBCUTANEOUS | Status: DC
  Administered 2018-12-13 – 2018-12-14 (×5): 3 [IU] via SUBCUTANEOUS

## 2018-12-11 MED ORDER — INSULIN ASPART 100 UNIT/ML ~~LOC~~ SOLN
0.0000 [IU] | SUBCUTANEOUS | Status: DC
  Administered 2018-12-11: 5 [IU] via SUBCUTANEOUS
  Administered 2018-12-11: 8 [IU] via SUBCUTANEOUS
  Administered 2018-12-12: 5 [IU] via SUBCUTANEOUS

## 2018-12-11 NOTE — Progress Notes (Signed)
CRITICAL VALUE ALERT  Critical Value: serum osmolality 418 Date & Time Notied:  12/11/2018 0021  Provider Notified: bodenheimer  Orders Received/Actions taken: none

## 2018-12-11 NOTE — Progress Notes (Signed)
Inpatient Diabetes Program Recommendations  AACE/ADA: New Consensus Statement on Inpatient Glycemic Control (2015)  Target Ranges:  Prepandial:   less than 140 mg/dL      Peak postprandial:   less than 180 mg/dL (1-2 hours)      Critically ill patients:  140 - 180 mg/dL   Lab Results  Component Value Date   GLUCAP 119 (H) 12/11/2018   HGBA1C 6.8 (H) 02/13/2014    Review of Glycemic Control  Diabetes history: HgbA1C 6.8% 5 years ago, but no diagnosis Outpatient Diabetes medications: None Current orders for Inpatient glycemic control: IV insulin transitioning to Lantus 10 units 1x @0645 , Novolog 0-15 units Q4H  Inpatient Diabetes Program Recommendations:     Lantus 10 units QAM. Titrate if FBS > 180 mg/dL Reduce Novolog to 0-9 units tidwc and hs Novolog 3 units tidwc for meal coverage insulin  Awaiting HgbA1C results.  Will likely need to go home on insulin. Will order Living Well with Diabetes book and will speak with pt today regarding new diagnosis. Will ask nursing to allow pt to participate in his diabetes care by sticking finger for monitoring and giving insulin injections. May benefit from OP Diabetes Education consult for new-onset DM.  Will continue to follow. Diabetes Coordinator will see on 7/20 am.  Thank you. Lorenda Peck, RD, LDN, CDE Inpatient Diabetes Coordinator [phone]

## 2018-12-11 NOTE — Evaluation (Signed)
Physical Therapy Evaluation Patient Details Name: Edward MorrowWillie L Holroyd MRN: [id number] DOB: 03-19-1951 Today's Date: 12/11/2018   History of Present Illness  68 yo male admitted with DKA, weakness. Hx of DM, CVA  Clinical Impression  On eval, pt required Min assist for mobility. He walked ~160 feet while holding on to the IV pole for support. HR up to 121 bpm during ambulation. Pt presents with general weakness, decreased activity tolerance, and impaired gait and balance. Discussed d/c plan-pt stated he plans to d/c home where his "friend" can help. Will continue to follow and progress activity as tolerated.     Follow Up Recommendations Home health PT;Supervision for mobility/OOB    Equipment Recommendations  Rolling walker with 5" wheels    Recommendations for Other Services       Precautions / Restrictions Precautions Precautions: Fall Restrictions Weight Bearing Restrictions: No      Mobility  Bed Mobility Overal bed mobility: Needs Assistance Bed Mobility: Supine to Sit     Supine to sit: HOB elevated;Min guard     General bed mobility comments: close guard for safety, line management. Increased time.  Transfers Overall transfer level: Needs assistance Equipment used: None Transfers: Sit to/from Stand Sit to Stand: Min assist         General transfer comment: assist to rise, stabilize, control descent. vcs safety, hand placement  Ambulation/Gait Ambulation/Gait assistance: Min assist Gait Distance (Feet): 160 Feet Assistive device: IV Pole Gait Pattern/deviations: Step-through pattern;Decreased stride length;Decreased step length - right;Decreased step length - left;Staggering left;Staggering right     General Gait Details: Assist to stabilize throughout distance. slow gait speed. Pt walked ~10 feet without UE support-less steady, more stumbles. HR 121 bpm.  Stairs            Wheelchair Mobility    Modified Rankin (Stroke Patients Only)        Balance Overall balance assessment: Needs assistance           Standing balance-Leahy Scale: Fair                               Pertinent Vitals/Pain Pain Assessment: No/denies pain    Home Living Family/patient expects to be discharged to:: Private residence Living Arrangements: Non-relatives/Friends(pt stated "friend") Available Help at Discharge: Friend(s) Type of Home: Apartment         Home Equipment: Gilmer MorCane - single point      Prior Function Level of Independence: Independent with assistive device(s)         Comments: uses cane PRN     Hand Dominance        Extremity/Trunk Assessment        Lower Extremity Assessment Lower Extremity Assessment: Generalized weakness    Cervical / Trunk Assessment Cervical / Trunk Assessment: Normal  Communication   Communication: No difficulties  Cognition Arousal/Alertness: Awake/alert Behavior During Therapy: WFL for tasks assessed/performed;Flat affect Overall Cognitive Status: Within Functional Limits for tasks assessed                                        General Comments      Exercises     Assessment/Plan    PT Assessment Patient needs continued PT services  PT Problem List Decreased strength;Decreased mobility;Decreased activity tolerance;Decreased balance;Decreased knowledge of use of DME       PT  Treatment Interventions DME instruction;Gait training;Therapeutic exercise;Therapeutic activities;Patient/family education;Balance training;Functional mobility training    PT Goals (Current goals can be found in the Care Plan section)  Acute Rehab PT Goals Patient Stated Goal: none stated PT Goal Formulation: With patient Time For Goal Achievement: 12/25/18 Potential to Achieve Goals: Good    Frequency Min 3X/week   Barriers to discharge        Co-evaluation               AM-PAC PT "6 Clicks" Mobility  Outcome Measure Help needed turning from your back  to your side while in a flat bed without using bedrails?: A Little Help needed moving from lying on your back to sitting on the side of a flat bed without using bedrails?: A Little Help needed moving to and from a bed to a chair (including a wheelchair)?: A Little Help needed standing up from a chair using your arms (e.g., wheelchair or bedside chair)?: A Little Help needed to walk in hospital room?: A Little Help needed climbing 3-5 steps with a railing? : A Little 6 Click Score: 18    End of Session Equipment Utilized During Treatment: Gait belt Activity Tolerance: Patient tolerated treatment well Patient left: in chair;with call bell/phone within reach;with chair alarm set   PT Visit Diagnosis: Muscle weakness (generalized) (M62.81);Unsteadiness on feet (R26.81)    Time: 5366-4403 PT Time Calculation (min) (ACUTE ONLY): 23 min   Charges:   PT Evaluation $PT Eval Moderate Complexity: 1 Mod PT Treatments $Gait Training: 8-22 mins         Weston Anna, PT Acute Rehabilitation Services Pager: [phone] Office: [phone]

## 2018-12-11 NOTE — Progress Notes (Addendum)
PROGRESS NOTE    Edward MorrowWillie L Morrow  WJX:[id number] DOB: Apr 19, 1951 DOA: 12/10/2018 PCP: Marden NobleGates, Robert, MD   Brief Narrative:  Edward Morrow is Edward Morrow 68 y.o. male with medical history significant of diabetes, HTN, CVA, HLD presenting with about 3 weeks of generalized weakness.  Pt notes his sx started about 3 weeks ago.  He states he's just felt progressively weak and dizzy.  No episodes of syncope.  He notes his sx have gotten worse.  No fever, chills, cough, cp, sob, nausea, vomiting, abdominal pain.  No sick contacts.  He's lost about 4 lbs over this period of time.  He's had decreased appetite, but increased thirst and UOP.  He denies Edward Morrow hx of diabetes, but it looks like he had a1c of 6.8 in 2015 based on chart review.  ED Course: In ED he received EKG, IVF, calcium gluconate, bicarb, insulin gtt.  Hospitalist called to admit.  Assessment & Plan:   Active Problems:   DKA (diabetic ketoacidoses) (HCC)   AKI (acute kidney injury) (HCC)   HTN (hypertension)   HLD (hyperlipidemia)   Hyperkalemia   QT prolongation   Hypothermia   Lactic acidosis  DKA   HHS   Diabetes: Denies hx of DM, but had A1c of 6.8 in 2015.  BG at presentation over 1200 and calc osms 379.  AG is 31 with elevated betahydroxybutyric acid as well. Gap closed, pt now transitioned to lantus 10 units with 3 units mealtime and SSI  Serum osm 418 at presentation No source of infection identified, will d/c abx  Diabetes educator  Follow c peptide, insulin antibodies, anti gad, and anti islet cell  Elevated Lipase: suspect related above, no sig abdominal pain.  Will plan for CT abdomen/pelvis tomorrow when kidney function [name] bit more improved. Normal triglycerides.  Normal LFT's and bili.  Acute Kidney Injury: baseline in 2019 was 1.04.  Peaked to 4.11 here on admission.  Likely 2/2 dehydration from above. Follow UA and renal US -> evidence of chronic medical renal disease Strict I/O, daily weights  Hypernatremia: 2/2  severe dehydration from above.  ~4 L free water deficit.   Encourage PO liquids.   Start 75 cc/hr of D5W.  Avoid overcorrection (goal of 6-8 mmol/L per day)  Hyperkalemia: improved  Anion Gap Metabolic Acidosis   Lactic Acidosis: improved.  Mild lactic acidosis.  Follow in AM.   Systemic Inflammatory Response Syndrome   Hypothermia: no clear source of infection, but with hypothermia, tachycardia, elevated WBC and soft BP will start vancomycin and cefepime. Follow blood and urine cultures.  Follow UA - not c/w UTI Follow CXR - negative COVID 19 test - negative, though he has test pending as well Will d/c abx and monitor  Nonsustained Vtach: occurred on tele while I was at bedside.  EF unknown.  Will follow echo tomorrow, continue to monitor on telemetry.  Prolonged QTc: follow repeat EKG in AM (pending).  Follow mag.  Hypertension: pt not sure of meds, will hold amlodipine on med list for now in setting of possible infection  HLD: pt unclear what meds he's taking, will discuss with family  Hx CVA: per chart hx, pt on ASA per chart and also notes he's taking cholesterol medication.  Will discuss further with pt and family  Will need to discuss meds further with family for collateral (but discussed with pharmacy who confirmed his rx)  DVT prophylaxis: heparin Code Status: full  Family Communication: none at bedside - discussed with son  Disposition Plan: pending further improvement   Consultants:   none  Procedures:  none  Antimicrobials: Anti-infectives (From admission, onward)   Start     Dose/Rate Route Frequency Ordered Stop   12/10/18 1730  ceFEPIme (MAXIPIME) 1 g in sodium chloride 0.9 % 100 mL IVPB     1 g 200 mL/hr over 30 Minutes Intravenous  Once 12/10/18 1710 12/10/18 2131   12/10/18 1730  vancomycin (VANCOCIN) 1,250 mg in sodium chloride 0.9 % 250 mL IVPB     1,250 mg 166.7 mL/hr over 90 Minutes Intravenous  Once 12/10/18 1710 12/10/18 1947        Subjective: Feeling ok, maybe Edward Morrow little stronger, but he hasn't been up and about yet  Objective: Vitals:   12/11/18 0500 12/11/18 0600 12/11/18 0747 12/11/18 0800  BP:  138/64  (!) 142/65  Pulse:  85  96  Resp:  18  20  Temp:   97.8 F (36.6 C)   TempSrc:   Oral   SpO2:  98%  99%  Weight: 63 kg     Height:        Intake/Output Summary (Last 24 hours) at 12/11/2018 1114 Last data filed at 12/11/2018 0500 Gross per 24 hour  Intake --  Output 1000 ml  Net -1000 ml   Filed Weights   12/10/18 1342 12/11/18 0500  Weight: 63.5 kg 63 kg    Examination:  General exam: Appears calm and comfortable  Respiratory system: Clear to auscultation. Respiratory effort normal. Cardiovascular system: S1 & S2 heard, RRR. Gastrointestinal system: Abdomen is nondistended, soft and nontender.  Central nervous system: Alert and oriented. No focal neurological deficits. Extremities: no lee Skin: No rashes, lesions or ulcers Psychiatry: Judgement and insight appear normal. Mood & affect appropriate.     Data Reviewed: I have personally reviewed following labs and imaging studies  CBC: Recent Labs  Lab 12/10/18 1123 12/10/18 1413 12/11/18 0513  WBC 13.4* 18.2* 17.0*  NEUTROABS 11.3* 15.3*  --   HGB 12.5* 12.6* 11.6*  HCT 43.3 43.7 39.3  MCV 76.0* 77.3* 72.9*  PLT 233 232 403   Basic Metabolic Panel: Recent Labs  Lab 12/10/18 1650 12/10/18 1830 12/10/18 2250 12/11/18 0513 12/11/18 0933  NA 143 146* 152* 158* 158*  K 4.5 3.7 3.9 3.9 4.1  CL 104 108 113* 122* 119*  CO2 13* 15* 23 28 23   GLUCOSE 1,055* 903* 510* 155* 196*  BUN 81* 74* 66* 55* 51*  CREATININE 3.88* 3.64* 2.98* 2.21* 1.93*  CALCIUM 9.3 9.2 9.4 9.4 9.3   GFR: Estimated Creatinine Clearance: 29.8 mL/min (Edward Morrow) (by C-G formula based on SCr of 1.93 mg/dL (H)). Liver Function Tests: Recent Labs  Lab 12/10/18 1123 12/11/18 0513  AST 12* 18  ALT 23 20  ALKPHOS 88 78  BILITOT 1.5* 0.2*  PROT 7.1 6.5  ALBUMIN  3.5 3.5   No results for input(s): LIPASE, AMYLASE in the last 168 hours. No results for input(s): AMMONIA in the last 168 hours. Coagulation Profile: No results for input(s): INR, PROTIME in the last 168 hours. Cardiac Enzymes: Recent Labs  Lab 12/10/18 1830  CKTOTAL 107   BNP (last 3 results) No results for input(s): PROBNP in the last 8760 hours. HbA1C: No results for input(s): HGBA1C in the last 72 hours. CBG: Recent Labs  Lab 12/11/18 0213 12/11/18 0315 12/11/18 0419 12/11/18 0530 12/11/18 0624  GLUCAP 217* 161* 123* 123* 119*   Lipid Profile: No results for input(s): CHOL, HDL, LDLCALC,  TRIG, CHOLHDL, LDLDIRECT in the last 72 hours. Thyroid Function Tests: No results for input(s): TSH, T4TOTAL, FREET4, T3FREE, THYROIDAB in the last 72 hours. Anemia Panel: No results for input(s): VITAMINB12, FOLATE, FERRITIN, TIBC, IRON, RETICCTPCT in the last 72 hours. Sepsis Labs: Recent Labs  Lab 12/10/18 1650 12/10/18 1806  LATICACIDVEN 2.5* 2.2*    Recent Results (from the past 240 hour(s))  SARS Coronavirus 2 (CEPHEID - Performed in [facility] hospital lab), Hosp Order     Status: None   Collection Time: 12/10/18  5:32 PM   Specimen: Nasopharyngeal Swab  Result Value Ref Range Status   SARS Coronavirus 2 NEGATIVE NEGATIVE Final    Comment: (NOTE) If result is NEGATIVE SARS-CoV-2 target nucleic acids are NOT DETECTED. The SARS-CoV-2 RNA is generally detectable in upper and lower  respiratory specimens during the acute phase of infection. The lowest  concentration of SARS-CoV-2 viral copies this assay can detect is 250  copies / mL. Atif Chapple negative result does not preclude SARS-CoV-2 infection  and should not be used as the sole basis for treatment or other  patient management decisions.  Suki Crockett negative result may occur with  improper specimen collection / handling, submission of specimen other  than nasopharyngeal swab, presence of viral mutation(s) within the  areas  targeted by this assay, and inadequate number of viral copies  (<250 copies / mL). Helyn Schwan negative result must be combined with clinical  observations, patient history, and epidemiological information. If result is POSITIVE SARS-CoV-2 target nucleic acids are DETECTED. The SARS-CoV-2 RNA is generally detectable in upper and lower  respiratory specimens dur ing the acute phase of infection.  Positive  results are indicative of active infection with SARS-CoV-2.  Clinical  correlation with patient history and other diagnostic information is  necessary to determine patient infection status.  Positive results do  not rule out bacterial infection or co-infection with other viruses. If result is PRESUMPTIVE POSTIVE SARS-CoV-2 nucleic acids MAY BE PRESENT.   Tandre Conly presumptive positive result was obtained on the submitted specimen  and confirmed on repeat testing.  While 2019 novel coronavirus  (SARS-CoV-2) nucleic acids may be present in the submitted sample  additional confirmatory testing may be necessary for epidemiological  and / or clinical management purposes  to differentiate between  SARS-CoV-2 and other Sarbecovirus currently known to infect humans.  If clinically indicated additional testing with an alternate test  methodology [phone] is advised. The SARS-CoV-2 RNA is generally  detectable in upper and lower respiratory sp ecimens during the acute  phase of infection. The expected result is Negative. Fact Sheet for Patients:  BoilerBrush.com.cyhttps://www.fda.gov/media/136312/download Fact Sheet for Healthcare Providers: https://pope.com/https://www.fda.gov/media/136313/download This test is not yet approved or cleared by the [place] FDA and has been authorized for detection and/or diagnosis of SARS-CoV-2 by FDA under an Emergency Use Authorization (EUA).  This EUA will remain in effect (meaning this test can be used) for the duration of the COVID-19 declaration under Section 564(b)(1) of the Act, 21 U.S.C. section  360bbb-3(b)(1), unless the authorization is terminated or revoked sooner. Performed at [facility], [address] [place], [place] [address]          Radiology Studies: [place] Renal  Result Date: 12/10/2018 CLINICAL DATA:  Acute kidney injury EXAM: RENAL / URINARY TRACT ULTRASOUND COMPLETE COMPARISON:  None FINDINGS: Right Kidney: Renal measurements: 9.0 x 5.0 x 5.7 cm = volume: 134 mL. 1.4 cm midpole cyst. Mildly increased echotexture. No hydronephrosis. Left Kidney: Renal measurements: 9.4  x 5.6 x 4.4 cm = volume: 121 mL. Upper pole cysts. Mildly increased echotexture. No hydronephrosis. Bladder: Appears normal for degree of bladder distention. IMPRESSION: Mildly increased echotexture in the kidneys bilaterally compatible with chronic medical renal disease. No acute findings. Electronically Signed   By: Charlett NoseKevin  Dover M.D.   On: 12/10/2018 18:18   Dg Chest Portable 1 View  Result Date: 12/10/2018 CLINICAL DATA:  New onset diabetes. EXAM: PORTABLE CHEST 1 VIEW COMPARISON:  None. FINDINGS: The heart size and mediastinal contours are within normal limits. Both lungs are clear. The visualized skeletal structures are unremarkable. IMPRESSION: No active disease. Electronically Signed   By: Elberta Fortisaniel  Boyle M.D.   On: 12/10/2018 17:51        Scheduled Meds:  Chlorhexidine Gluconate Cloth  6 each Topical Q0600   heparin  5,000 Units Subcutaneous Q8H   insulin aspart  0-15 Units Subcutaneous Q4H   insulin aspart  3 Units Subcutaneous TID WC   polyethylene glycol  17 g Oral Daily   Continuous Infusions:  dextrose 75 mL/hr at 12/11/18 0831     LOS: 1 day    Time spent: over 30 min    Lacretia Nicksaldwell Powell, MD [facility] Pager AMION  If 7PM-7AM, please contact night-coverage [email or web address] Password [email or web address] 12/11/2018, 11:14 AM

## 2018-12-12 ENCOUNTER — Inpatient Hospital Stay (HOSPITAL_COMMUNITY): Payer: Medicare Other

## 2018-12-12 LAB — COMPREHENSIVE METABOLIC PANEL
ALT: 22 U/L (ref 0–44)
AST: 33 U/L (ref 15–41)
Albumin: 2.9 g/dL — ABNORMAL LOW (ref 3.5–5.0)
Alkaline Phosphatase: 75 U/L (ref 38–126)
Anion gap: 9 (ref 5–15)
BUN: 27 mg/dL — ABNORMAL HIGH (ref 8–23)
CO2: 28 mmol/L (ref 22–32)
Calcium: 8.7 mg/dL — ABNORMAL LOW (ref 8.9–10.3)
Chloride: 116 mmol/L — ABNORMAL HIGH (ref 98–111)
Creatinine, Ser: 1.32 mg/dL — ABNORMAL HIGH (ref 0.61–1.24)
GFR calc Af Amer: >60 mL/min (ref >60)
GFR calc non Af Amer: 54 mL/min — ABNORMAL LOW (ref >60)
Glucose, Bld: 275 mg/dL — ABNORMAL HIGH (ref 70–99)
Potassium: 3.7 mmol/L (ref 3.5–5.1)
Sodium: 153 mmol/L — ABNORMAL HIGH (ref 135–145)
Total Bilirubin: 0.4 mg/dL (ref 0.3–1.2)
Total Protein: 6 g/dL — ABNORMAL LOW (ref 6.5–8.1)

## 2018-12-12 LAB — LIPID PANEL
Cholesterol: 102 mg/dL (ref 0–200)
HDL: 55 mg/dL (ref >40)
LDL Cholesterol: 32 mg/dL (ref 0–99)
Total CHOL/HDL Ratio: 1.9 RATIO
Triglycerides: 73 mg/dL (ref <150)
VLDL: 15 mg/dL (ref 0–40)

## 2018-12-12 LAB — BASIC METABOLIC PANEL
Anion gap: 11 (ref 5–15)
BUN: 18 mg/dL (ref 8–23)
CO2: 27 mmol/L (ref 22–32)
Calcium: 8.2 mg/dL — ABNORMAL LOW (ref 8.9–10.3)
Chloride: 107 mmol/L (ref 98–111)
Creatinine, Ser: 1.12 mg/dL (ref 0.61–1.24)
GFR calc Af Amer: >60 mL/min (ref >60)
GFR calc non Af Amer: >60 mL/min (ref >60)
Glucose, Bld: 199 mg/dL — ABNORMAL HIGH (ref 70–99)
Potassium: 3.1 mmol/L — ABNORMAL LOW (ref 3.5–5.1)
Sodium: 145 mmol/L (ref 135–145)

## 2018-12-12 LAB — MAGNESIUM: Magnesium: 2.2 mg/dL (ref 1.7–2.4)

## 2018-12-12 LAB — CBC
HCT: 37.5 % — ABNORMAL LOW (ref 39.0–52.0)
Hemoglobin: 11.2 g/dL — ABNORMAL LOW (ref 13.0–17.0)
MCH: 21.9 pg — ABNORMAL LOW (ref 26.0–34.0)
MCHC: 29.9 g/dL — ABNORMAL LOW (ref 30.0–36.0)
MCV: 73.4 fL — ABNORMAL LOW (ref 80.0–100.0)
Platelets: 172 10*3/uL (ref 150–400)
RBC: 5.11 MIL/uL (ref 4.22–5.81)
RDW: 15.7 % — ABNORMAL HIGH (ref 11.5–15.5)
WBC: 12.2 10*3/uL — ABNORMAL HIGH (ref 4.0–10.5)
nRBC: 0 % (ref 0.0–0.2)

## 2018-12-12 LAB — GLUCOSE, CAPILLARY
Glucose-Capillary: 184 mg/dL — ABNORMAL HIGH (ref 70–99)
Glucose-Capillary: 202 mg/dL — ABNORMAL HIGH (ref 70–99)
Glucose-Capillary: 209 mg/dL — ABNORMAL HIGH (ref 70–99)
Glucose-Capillary: 225 mg/dL — ABNORMAL HIGH (ref 70–99)
Glucose-Capillary: 231 mg/dL — ABNORMAL HIGH (ref 70–99)
Glucose-Capillary: 238 mg/dL — ABNORMAL HIGH (ref 70–99)
Glucose-Capillary: 69 mg/dL — ABNORMAL LOW (ref 70–99)

## 2018-12-12 LAB — URINE CULTURE: Culture: <10000 — AB

## 2018-12-12 LAB — LACTIC ACID, PLASMA: Lactic Acid, Venous: 1 mmol/L (ref 0.5–1.9)

## 2018-12-12 LAB — LIPASE, BLOOD: Lipase: 370 U/L — ABNORMAL HIGH (ref 11–51)

## 2018-12-12 MED ORDER — INSULIN GLARGINE 100 UNIT/ML ~~LOC~~ SOLN
10.0000 [IU] | Freq: Every day | SUBCUTANEOUS | Status: DC
  Administered 2018-12-12 – 2018-12-13 (×2): 10 [IU] via SUBCUTANEOUS
  Filled 2018-12-12 (×2): qty 0.1

## 2018-12-12 MED ORDER — PRO-STAT SUGAR FREE PO LIQD
30.0000 mL | Freq: Two times a day (BID) | ORAL | Status: DC
  Administered 2018-12-13 – 2018-12-14 (×2): 30 mL via ORAL
  Filled 2018-12-12 (×2): qty 30

## 2018-12-12 MED ORDER — SODIUM CHLORIDE (PF) 0.9 % IJ SOLN
INTRAMUSCULAR | Status: AC
  Filled 2018-12-12: qty 50

## 2018-12-12 MED ORDER — ENSURE MAX PROTEIN PO LIQD
11.0000 [oz_av] | Freq: Every day | ORAL | Status: DC
  Administered 2018-12-13: 11 [oz_av] via ORAL
  Filled 2018-12-12 (×2): qty 330

## 2018-12-12 MED ORDER — INSULIN ASPART 100 UNIT/ML ~~LOC~~ SOLN
0.0000 [IU] | Freq: Every day | SUBCUTANEOUS | Status: DC
  Administered 2018-12-12: 2 [IU] via SUBCUTANEOUS

## 2018-12-12 MED ORDER — INSULIN ASPART 100 UNIT/ML ~~LOC~~ SOLN
0.0000 [IU] | Freq: Three times a day (TID) | SUBCUTANEOUS | Status: DC
  Administered 2018-12-12 – 2018-12-13 (×4): 3 [IU] via SUBCUTANEOUS
  Administered 2018-12-13: 1 [IU] via SUBCUTANEOUS
  Administered 2018-12-13: 5 [IU] via SUBCUTANEOUS
  Administered 2018-12-14: 2 [IU] via SUBCUTANEOUS
  Administered 2018-12-14: 3 [IU] via SUBCUTANEOUS
  Administered 2018-12-14: 2 [IU] via SUBCUTANEOUS

## 2018-12-12 MED ORDER — ADULT MULTIVITAMIN W/MINERALS CH
1.0000 | ORAL_TABLET | Freq: Every day | ORAL | Status: DC
  Administered 2018-12-12 – 2018-12-14 (×3): 1 via ORAL
  Filled 2018-12-12 (×3): qty 1

## 2018-12-12 MED ORDER — LIP MEDEX EX OINT
TOPICAL_OINTMENT | CUTANEOUS | Status: DC | PRN
  Filled 2018-12-12: qty 7

## 2018-12-12 MED ORDER — DEXTROSE 50 % IV SOLN
INTRAVENOUS | Status: AC
  Administered 2018-12-12: 50 mL
  Filled 2018-12-12: qty 50

## 2018-12-12 MED ORDER — IOHEXOL 300 MG/ML  SOLN
100.0000 mL | Freq: Once | INTRAMUSCULAR | Status: AC | PRN
  Administered 2018-12-12: 100 mL via INTRAVENOUS

## 2018-12-12 NOTE — Progress Notes (Signed)
Hypoglycemic Event  CBG: 71  Treatment: 4oz Applejuice FIRST, rechecked and CBG-69, THEN  1amp of D50  Symptoms: Asymptomatic   Follow-up CBG: CBG Result:184  Possible Reasons for Event: no oral intake   Comments/MD notified: RN will continue to monitor     Edward Morrow

## 2018-12-12 NOTE — Progress Notes (Signed)
PROGRESS NOTE    Edward Morrow  BJY:[id number] DOB: 08-19-1950 DOA: 12/10/2018 PCP: Marden Noble, Morrow   Brief Narrative:  Edward Morrow is Edward Morrow 68 y.o. male with medical history significant of diabetes, HTN, CVA, HLD presenting with about 3 weeks of generalized weakness.  Pt notes his sx started about 3 weeks ago.  He states he's just felt progressively weak and dizzy.  No episodes of syncope.  He notes his sx have gotten worse.  No fever, chills, cough, cp, sob, nausea, vomiting, abdominal pain.  No sick contacts.  He's lost about 4 lbs over this period of time.  He's had decreased appetite, but increased thirst and UOP.  He denies Linkon Siverson hx of diabetes, but it looks like he had a1c of 6.8 in 2015 based on chart review.  ED Course: In ED he received EKG, IVF, calcium gluconate, bicarb, insulin gtt.  Hospitalist called to admit.  Assessment & Plan:   Active Problems:   DKA (diabetic ketoacidoses) (HCC)   AKI (acute kidney injury) (HCC)   HTN (hypertension)   HLD (hyperlipidemia)   Hyperkalemia   QT prolongation   Hypothermia   Lactic acidosis  DKA  HHS  Diabetes: Denies hx of DM, but had A1c of 6.8 in 2015.  BG at presentation over 1200 and calc osms 379.  AG is 31 with elevated betahydroxybutyric acid as well. Gap closed, pt now transitioned to lantus 10 units with 3 units mealtime and SSI (hypoglycemia overnight, will space SSI out to ACHS from q4) Serum osm 418 at presentation No source of infection identified, will d/c abx  Diabetes educator  Follow c peptide, insulin antibodies, anti gad, and anti islet cell  Elevated Lipase: suspect related above, no sig abdominal pain.   CT abdomen/pelvis 7/19 pending Normal triglycerides.  Normal LFT's and bili.  No etoh.  Acute Kidney Injury: baseline in 2019 was 1.04.  Peaked to 4.11 here on admission.  Likely 2/2 dehydration from above. Improving, continue IVF Follow UA and renal US -> evidence of chronic medical renal disease  Strict I/O, daily weights  Hypernatremia: 2/2 severe dehydration from above.  ~4 L free water deficit.   Encourage PO liquids.   Start 75 cc/hr of D5W.  Avoid overcorrection (goal of 6-8 mmol/L per day)  Hyperkalemia: improved  Anion Gap Metabolic Acidosis  Lactic Acidosis: resolved  Systemic Inflammatory Response Syndrome  Hypothermia: no clear source of infection, but with hypothermia, tachycardia, elevated WBC and soft BP will start vancomycin and cefepime. Follow blood and urine cultures (NGTD).  Follow UA - not c/w UTI Follow CXR - negative COVID 19 test - negative, though he has test pending as well Will d/c abx and monitor  Nonsustained Vtach: occurred on tele while I was at bedside.   EF 60-65% (see report) Consider adding beta blocker if recurrent  Prolonged QTc:  improved  Hypertension: pt not sure of meds, will hold amlodipine on med list for now in setting of possible infection  HLD: pt unclear what meds he's taking, will discuss with family  Hx CVA: per chart hx, pt on ASA per chart and also notes he's taking cholesterol medication.  Will discuss further with pt and family  Will need to discuss meds further with family for collateral (but discussed with pharmacy who confirmed his rx)  DVT prophylaxis: heparin Code Status: full  Family Communication: none at bedside - discussed with son Disposition Plan: pending further improvement   Consultants:   none  Procedures:  Echo IMPRESSIONS    1. The left ventricle has normal systolic function with an ejection fraction of 60-65%. The cavity size was normal. Left ventricular diastolic parameters were normal.  2. The right ventricle has normal systolic function. The cavity was normal.  3. The mitral valve is grossly normal. Mild thickening of the mitral valve leaflet.  4. The tricuspid valve is grossly normal.  5. The aortic valve was not well visualized. No stenosis of the aortic valve.  6. The  aortic root is normal in size and structure.  7. Normal LV function.  Antimicrobials: Anti-infectives (From admission, onward)   Start     Dose/Rate Route Frequency Ordered Stop   12/10/18 1730  ceFEPIme (MAXIPIME) 1 g in sodium chloride 0.9 % 100 mL IVPB     1 g 200 mL/hr over 30 Minutes Intravenous  Once 12/10/18 1710 12/10/18 2131   12/10/18 1730  vancomycin (VANCOCIN) 1,250 mg in sodium chloride 0.9 % 250 mL IVPB     1,250 mg 166.7 mL/hr over 90 Minutes Intravenous  Once 12/10/18 1710 12/10/18 1947       Subjective: Feels ok Feels like "service is slow"  Objective: Vitals:   12/12/18 0823 12/12/18 0900 12/12/18 1000 12/12/18 1100  BP:  (!) 157/78 135/69 126/70  Pulse:  97 67 68  Resp:  13 (!) 9 (!) 8  Temp: 98.5 F (36.9 C)     TempSrc: Oral     SpO2:  97% 99% 100%  Weight:      Height:        Intake/Output Summary (Last 24 hours) at 12/12/2018 1200 Last data filed at 12/12/2018 0800 Gross per 24 hour  Intake 2722.33 ml  Output 1800 ml  Net 922.33 ml   Filed Weights   12/10/18 1342 12/11/18 0500 12/12/18 0500  Weight: 63.5 kg 63 kg 65.5 kg    Examination:  General: No acute distress. Cardiovascular: Heart sounds show Siarra Gilkerson regular rate, and rhythm. Lungs: Clear to auscultation bilaterally . Abdomen: Soft, nontender, nondistended Neurological: Alert and oriented 3. Moves all extremities 4 . Cranial nerves II through XII grossly intact. Skin: Warm and dry. No rashes or lesions. Extremities: No clubbing or cyanosis. No edema.      Data Reviewed: I have personally reviewed following labs and imaging studies  CBC: Recent Labs  Lab 12/10/18 1123 12/10/18 1413 12/11/18 0513 12/12/18 0243  WBC 13.4* 18.2* 17.0* 12.2*  NEUTROABS 11.3* 15.3*  --   --   HGB 12.5* 12.6* 11.6* 11.2*  HCT 43.3 43.7 39.3 37.5*  MCV 76.0* 77.3* 72.9* 73.4*  PLT 233 232 195 400   Basic Metabolic Panel: Recent Labs  Lab 12/11/18 0513 12/11/18 0933 12/11/18 1154 12/11/18  1547 12/12/18 0243  NA 158* 158* 155* 155* 153*  K 3.9 4.1 4.2 4.3 3.7  CL 122* 119* 119* 119* 116*  CO2 28 23 26 24 28   GLUCOSE 155* 196* 270* 307* 275*  BUN 55* 51* 50* 41* 27*  CREATININE 2.21* 1.93* 1.95* 1.63* 1.32*  CALCIUM 9.4 9.3 9.1 9.3 8.7*  MG  --   --   --   --  2.2   GFR: Estimated Creatinine Clearance: 43.6 mL/min (Edward Morrow) (by C-G formula based on SCr of 1.32 mg/dL (H)). Liver Function Tests: Recent Labs  Lab 12/10/18 1123 12/11/18 0513 12/12/18 0243  AST 12* 18 33  ALT 23 20 22   ALKPHOS 88 78 75  BILITOT 1.5* 0.2* 0.4  PROT 7.1 6.5 6.0*  ALBUMIN  3.5 3.5 2.9*   Recent Labs  Lab 12/11/18 1154 12/12/18 0243  LIPASE 974* 370*   No results for input(s): AMMONIA in the last 168 hours. Coagulation Profile: No results for input(s): INR, PROTIME in the last 168 hours. Cardiac Enzymes: Recent Labs  Lab 12/10/18 1830  CKTOTAL 107   BNP (last 3 results) No results for input(s): PROBNP in the last 8760 hours. HbA1C: Recent Labs    12/11/18 0630  HGBA1C 16.8*   CBG: Recent Labs  Lab 12/12/18 0025 12/12/18 0050 12/12/18 0356 12/12/18 0758 12/12/18 1124  GLUCAP 69* 184* 231* 209* 202*   Lipid Profile: Recent Labs    12/11/18 1547 12/12/18 0243  CHOL  --  102  HDL  --  55  LDLCALC  --  32  TRIG 65 73  CHOLHDL  --  1.9   Thyroid Function Tests: Recent Labs    12/11/18 1547  TSH 0.230*   Anemia Panel: No results for input(s): VITAMINB12, FOLATE, FERRITIN, TIBC, IRON, RETICCTPCT in the last 72 hours. Sepsis Labs: Recent Labs  Lab 12/10/18 1650 12/10/18 1806 12/12/18 0243  LATICACIDVEN 2.5* 2.2* 1.0    Recent Results (from the past 240 hour(s))  Novel Coronavirus, NAA (hospital order; send-out to ref lab)     Status: None   Collection Time: 12/10/18  9:54 AM   Specimen: Nasopharyngeal Swab; Respiratory  Result Value Ref Range Status   SARS-CoV-2, NAA NOT DETECTED NOT DETECTED Final    Comment: (NOTE) This test was developed and its  performance characteristics determined by [facility]. This test has not been FDA cleared or approved. This test has been authorized by FDA under an Emergency Use Authorization (EUA). This test is only authorized for the duration of time the declaration that circumstances exist justifying the authorization of the emergency use of in vitro diagnostic tests for detection of SARS-CoV-2 virus and/or diagnosis of COVID-19 infection under section 564(b)(1) of the Act, 21 U.S.C. 161WRU-0(Edward Morrow)(5360bbb-3(b)(1), unless the authorization is terminated or revoked sooner. When diagnostic testing is negative, the possibility of Yakelin Grenier false negative result should be considered in the context of Demarus Latterell patient's recent exposures and the presence of clinical signs and symptoms consistent with COVID-19. An individual without symptoms of COVID-19 and who is not shedding SARS-CoV-2 virus would expect to have Emalee Knies negative (not detected) result in this assay. Performed  At: [facility] [address] [place], [place] [id number] Edward SchimkeNagendra Sanjai Morrow NW:[id number]:[phone]    Coronavirus Source NASOPHARYNGEAL  Final    Comment: Performed at [facility], [address] [place], [place] [address]  Blood culture (routine x 2)     Status: None (Preliminary result)   Collection Time: 12/10/18  4:50 PM   Specimen: BLOOD  Result Value Ref Range Status   Specimen Description   Final    BLOOD RIGHT ANTECUBITAL Performed at [facility], [address] [place], [place] [address]    Special Requests   Final    BOTTLES DRAWN AEROBIC AND ANAEROBIC Blood Culture adequate volume Performed at [facility], [address] [place], [place] [address]    Culture   Final    NO GROWTH 2 DAYS Performed at [facility], [address] [place], [place] [address]    Report Status PENDING  Incomplete  SARS Coronavirus 2 (CEPHEID - Performed in [facility] hospital lab), Hosp Order     Status:  None   Collection Time: 12/10/18  5:32 PM  Specimen: Nasopharyngeal Swab  Result Value Ref Range Status   SARS Coronavirus 2 NEGATIVE NEGATIVE Final    Comment: (NOTE) If result is NEGATIVE SARS-CoV-2 target nucleic acids are NOT DETECTED. The SARS-CoV-2 RNA is generally detectable in upper and lower  respiratory specimens during the acute phase of infection. The lowest  concentration of SARS-CoV-2 viral copies this assay can detect is 250  copies / mL. Edward Morrow negative result does not preclude SARS-CoV-2 infection  and should not be used as the sole basis for treatment or other  patient management decisions.  Deshara Rossi negative result may occur with  improper specimen collection / handling, submission of specimen other  than nasopharyngeal swab, presence of viral mutation(s) within the  areas targeted by this assay, and inadequate number of viral copies  (<250 copies / mL). Edward Morrow negative result must be combined with clinical  observations, patient history, and epidemiological information. If result is POSITIVE SARS-CoV-2 target nucleic acids are DETECTED. The SARS-CoV-2 RNA is generally detectable in upper and lower  respiratory specimens dur ing the acute phase of infection.  Positive  results are indicative of active infection with SARS-CoV-2.  Clinical  correlation with patient history and other diagnostic information is  necessary to determine patient infection status.  Positive results do  not rule out bacterial infection or co-infection with other viruses. If result is PRESUMPTIVE POSTIVE SARS-CoV-2 nucleic acids MAY BE PRESENT.   Edward Morrow presumptive positive result was obtained on the submitted specimen  and confirmed on repeat testing.  While 2019 novel coronavirus  (SARS-CoV-2) nucleic acids may be present in the submitted sample  additional confirmatory testing may be necessary for epidemiological  and / or clinical management purposes  to differentiate between  SARS-CoV-2 and other  Sarbecovirus currently known to infect humans.  If clinically indicated additional testing with an alternate test  methodology [phone]([id number]) is advised. The SARS-CoV-2 RNA is generally  detectable in upper and lower respiratory sp ecimens during the acute  phase of infection. The expected result is Negative. Fact Sheet for Patients:  BoilerBrush.com.cyhttps://www.fda.gov/media/136312/download Fact Sheet for Healthcare Providers: https://pope.com/https://www.fda.gov/media/136313/download This test is not yet approved or cleared by the [place] FDA and has been authorized for detection and/or diagnosis of SARS-CoV-2 by FDA under an Emergency Use Authorization (EUA).  This EUA will remain in effect (meaning this test can be used) for the duration of the COVID-19 declaration under Section 564(b)(1) of the Act, 21 U.S.C. section 360bbb-3(b)(1), unless the authorization is terminated or revoked sooner. Performed at [facility], [address] [place], [place] [address]   Blood culture (routine x 2)     Status: None (Preliminary result)   Collection Time: 12/10/18  6:06 PM   Specimen: BLOOD  Result Value Ref Range Status   Specimen Description   Final    BLOOD LEFT ANTECUBITAL Performed at [facility], [address] [place], [place] [address]    Special Requests   Final    BOTTLES DRAWN AEROBIC AND ANAEROBIC Blood Culture adequate volume Performed at [facility], [address] [place], [place] [address]    Culture   Final    NO GROWTH 2 DAYS Performed at [facility], [address] [place], [place] [address]    Report Status PENDING  Incomplete  Culture, Urine     Status: Abnormal   Collection Time: 12/10/18  6:22 PM   Specimen: Urine, Clean Catch  Result Value Ref Range Status   Specimen  Description   Final    URINE, CLEAN CATCH Performed at [facility], [address] [place], [place] [address]    Special Requests    Final    NONE Performed at [facility], [address] [place], [place] [address]    Culture (Edward Morrow)  Final    <10,000 COLONIES/mL INSIGNIFICANT GROWTH Performed at [facility], [address] [place], [place] [address]    Report Status 12/12/2018 FINAL  Final         Radiology Studies: Koreas Renal  Result Date: 12/10/2018 CLINICAL DATA:  Acute kidney injury EXAM: RENAL / URINARY TRACT ULTRASOUND COMPLETE COMPARISON:  None FINDINGS: Right Kidney: Renal measurements: 9.0 x 5.0 x 5.7 cm = volume: 134 mL. 1.4 cm midpole cyst. Mildly increased echotexture. No hydronephrosis. Left Kidney: Renal measurements: 9.4 x 5.6 x 4.4 cm = volume: 121 mL. Upper pole cysts. Mildly increased echotexture. No hydronephrosis. Bladder: Appears normal for degree of bladder distention. IMPRESSION: Mildly increased echotexture in the kidneys bilaterally compatible with chronic medical renal disease. No acute findings. Electronically Signed   By: Edward NoseKevin  Dover M.D.   On: 12/10/2018 18:18   Dg Chest Portable 1 View  Result Date: 12/10/2018 CLINICAL DATA:  New onset diabetes. EXAM: PORTABLE CHEST 1 VIEW COMPARISON:  None. FINDINGS: The heart size and mediastinal contours are within normal limits. Both lungs are clear. The visualized skeletal structures are unremarkable. IMPRESSION: No active disease. Electronically Signed   By: Edward Fortisaniel  Boyle M.D.   On: 12/10/2018 17:51        Scheduled Meds: . Chlorhexidine Gluconate Cloth  6 each Topical Q0600  . feeding supplement (PRO-STAT SUGAR FREE 64)  30 mL Oral BID  . heparin  5,000 Units Subcutaneous Q8H  . insulin aspart  0-5 Units Subcutaneous QHS  . insulin aspart  0-9 Units Subcutaneous TID WC  . insulin aspart  3 Units Subcutaneous TID WC  . insulin glargine  10 Units Subcutaneous Daily  . multivitamin with minerals  1 tablet Oral Daily  . polyethylene glycol  17 g Oral Daily  . Ensure Max Protein  11 oz Oral Daily   Continuous  Infusions: . dextrose 75 mL/hr at 12/11/18 1844  . lactated ringers 75 mL/hr at 12/11/18 1844     LOS: 2 days    Time spent: over 30 min    Edward Nicksaldwell Powell, Morrow [facility] Pager AMION  If 7PM-7AM, please contact night-coverage [email or web address] Password [email or web address] 12/12/2018, 12:00 PM

## 2018-12-12 NOTE — Progress Notes (Signed)
Initial Nutrition Assessment  RD working remotely.   DOCUMENTATION CODES:   (unable to assess for malnutrition at this time.)  INTERVENTION:  - will order Ensure Max once/day, each supplement provides 150 kcal and 30 grams protein. - will order 30 mL Prostat BID, each supplement provides 100 kcal and 15 grams of protein. - will order daily multivitamin with minerals. - continue to encourage PO intakes.  *If DM educations desired, recommend consult prior to d/c. No RD on site today but will be on site throughout the upcoming week.   NUTRITION DIAGNOSIS:   Inadequate oral intake related to acute illness, decreased appetite as evidenced by per patient/family report.  GOAL:   Patient will meet greater than or equal to 90% of their needs  MONITOR:   PO intake, Supplement acceptance, Labs, Weight trends, I & O's  REASON FOR ASSESSMENT:   Malnutrition Screening Tool  ASSESSMENT:   68 y.o. male with medical history significant of DM, HTN, CVA, and HLD. He presented to the ED d/t 3 weeks of progressive generalized weakness and dizziness. He reported that in that time frame he lost 4 lb due to decreased appetite. He was experiencing increased thirst and urine output. He denied having hx of DM, but chart review indicated HgbA1c in 2015.  No intakes documented since admission. Patient reported decreased appetite and intakes of solid foods during the past 3 weeks. He was not experiencing any N/V or abdominal pain during that time. He was consuming more liquids than usual d/t increased sense of thirst.   Patient has not had DM education in the past and on admission had reported not having a hx of DM. Admission was for DKA. DM Coordinator saw patient yesterday AM.  Per chart review, current weight is 144 lb and last recorded weight PTA was on 06/12/17 when he weighed 152 lb. This indicates 8 lb weight loss (5.3% body weight) which is not significant for 1.5 year time frame. Patient reported  that in the past 3 weeks he has lost 4 lb. This would indicate 2.7% body weight in that time frame; not significant.    Medications reviewed; sliding scale novolog, 3 units novolog TID, 10 units lantus/day, 1 packet miralax/day. Labs reviewed; CBGs: 69, 184, 231, and 209 mg/dl today, Na: 153 mmol/l, Cl: 116 mmol/l, BUN: 27 mg/dl, creatinine: 1.32 mg/dl, Ca: 8.7 mg/dl, lipase: 370 units/L. IVF; D5 @ 75 ml/hr (306 kcal).      NUTRITION - FOCUSED PHYSICAL EXAM:  unable to complete at this time.    Diet Order:   Diet Order            Diet heart healthy/carb modified Room service appropriate? Yes; Fluid consistency: Thin  Diet effective now              EDUCATION NEEDS:   Not appropriate for education at this time  Skin:  Skin Assessment: Reviewed RN Assessment  Last BM:  PTA/unknown  Height:   Ht Readings from Last 1 Encounters:  12/10/18 5\' 4"  (1.626 m)    Weight:   Wt Readings from Last 1 Encounters:  12/12/18 65.5 kg    Ideal Body Weight:  59.1 kg  BMI:  Body mass index is 24.79 kg/m.  Estimated Nutritional Needs:   Kcal:  1900-2100 kcal  Protein:  85-100 grams  Fluid:  >/= 2.2 L/day      Jarome Matin, MS, RD, LDN, [facility] Inpatient Clinical Dietitian Pager # [phone] After hours/weekend pager # [phone]

## 2018-12-12 NOTE — Progress Notes (Signed)
Inpatient Diabetes Program Recommendations  AACE/ADA: New Consensus Statement on Inpatient Glycemic Control (2015)  Target Ranges:  Prepandial:   less than 140 mg/dL      Peak postprandial:   less than 180 mg/dL (1-2 hours)      Critically ill patients:  140 - 180 mg/dL   Lab Results  Component Value Date   GLUCAP 202 (H) 12/12/2018   HGBA1C 16.8 (H) 12/11/2018    Review of Glycemic Control  FBS 209 mg/dL.  Inpatient Diabetes Program Recommendations:     Increase Lantus to 12 units QD.  Will see pt in am to discuss HgbA1C of 16.8%  Thank you. Lorenda Peck, RD, LDN, CDE Inpatient Diabetes Coordinator [phone]

## 2018-12-13 ENCOUNTER — Inpatient Hospital Stay (HOSPITAL_COMMUNITY): Payer: Medicare Other

## 2018-12-13 LAB — CBC
HCT: 33.5 % — ABNORMAL LOW (ref 39.0–52.0)
Hemoglobin: 9.8 g/dL — ABNORMAL LOW (ref 13.0–17.0)
MCH: 21.5 pg — ABNORMAL LOW (ref 26.0–34.0)
MCHC: 29.3 g/dL — ABNORMAL LOW (ref 30.0–36.0)
MCV: 73.6 fL — ABNORMAL LOW (ref 80.0–100.0)
Platelets: 143 10*3/uL — ABNORMAL LOW (ref 150–400)
RBC: 4.55 MIL/uL (ref 4.22–5.81)
RDW: 15.1 % (ref 11.5–15.5)
WBC: 8.5 10*3/uL (ref 4.0–10.5)
nRBC: 0 % (ref 0.0–0.2)

## 2018-12-13 LAB — GLUCOSE, CAPILLARY
Glucose-Capillary: 589 mg/dL (ref 70–99)
Glucose-Capillary: 257 mg/dL — ABNORMAL HIGH (ref 70–99)
Glucose-Capillary: 213 mg/dL — ABNORMAL HIGH (ref 70–99)
Glucose-Capillary: 131 mg/dL — ABNORMAL HIGH (ref 70–99)
Glucose-Capillary: 166 mg/dL — ABNORMAL HIGH (ref 70–99)

## 2018-12-13 LAB — C-PEPTIDE: C-Peptide: 0.3 ng/mL — ABNORMAL LOW (ref 1.1–4.4)

## 2018-12-13 LAB — COMPREHENSIVE METABOLIC PANEL
ALT: 27 U/L (ref 0–44)
AST: 39 U/L (ref 15–41)
Albumin: 2.6 g/dL — ABNORMAL LOW (ref 3.5–5.0)
Alkaline Phosphatase: 71 U/L (ref 38–126)
Anion gap: 9 (ref 5–15)
BUN: 16 mg/dL (ref 8–23)
CO2: 27 mmol/L (ref 22–32)
Calcium: 8 mg/dL — ABNORMAL LOW (ref 8.9–10.3)
Chloride: 107 mmol/L (ref 98–111)
Creatinine, Ser: 1.1 mg/dL (ref 0.61–1.24)
GFR calc Af Amer: >60 mL/min (ref >60)
GFR calc non Af Amer: >60 mL/min (ref >60)
Glucose, Bld: 248 mg/dL — ABNORMAL HIGH (ref 70–99)
Potassium: 3.2 mmol/L — ABNORMAL LOW (ref 3.5–5.1)
Sodium: 143 mmol/L (ref 135–145)
Total Bilirubin: 0.3 mg/dL (ref 0.3–1.2)
Total Protein: 5.3 g/dL — ABNORMAL LOW (ref 6.5–8.1)

## 2018-12-13 LAB — MAGNESIUM: Magnesium: 2 mg/dL (ref 1.7–2.4)

## 2018-12-13 LAB — HEMOGLOBIN A1C
Hgb A1c MFr Bld: >15.5 % — ABNORMAL HIGH (ref 4.8–5.6)
Mean Plasma Glucose: >398 mg/dL

## 2018-12-13 LAB — MRSA PCR SCREENING: MRSA by PCR: NEGATIVE

## 2018-12-13 LAB — T4, FREE: Free T4: 0.78 ng/dL (ref 0.61–1.12)

## 2018-12-13 MED ORDER — CHLORHEXIDINE GLUCONATE CLOTH 2 % EX PADS
6.0000 | MEDICATED_PAD | Freq: Every day | CUTANEOUS | Status: DC
  Administered 2018-12-13 – 2018-12-14 (×2): 6 via TOPICAL

## 2018-12-13 MED ORDER — INSULIN STARTER KIT- PEN NEEDLES (ENGLISH)
1.0000 | Freq: Once | Status: AC
  Administered 2018-12-13: 1
  Filled 2018-12-13: qty 1

## 2018-12-13 MED ORDER — AMLODIPINE BESYLATE 5 MG PO TABS
5.0000 mg | ORAL_TABLET | Freq: Every day | ORAL | Status: DC
  Administered 2018-12-13 – 2018-12-14 (×2): 5 mg via ORAL
  Filled 2018-12-13 (×2): qty 1

## 2018-12-13 MED ORDER — SIMVASTATIN 20 MG PO TABS
20.0000 mg | ORAL_TABLET | Freq: Every day | ORAL | Status: DC
  Administered 2018-12-13 – 2018-12-14 (×2): 20 mg via ORAL
  Filled 2018-12-13: qty 1
  Filled 2018-12-13: qty 2

## 2018-12-13 MED ORDER — INSULIN GLARGINE 100 UNIT/ML ~~LOC~~ SOLN
12.0000 [IU] | Freq: Every day | SUBCUTANEOUS | Status: DC
  Administered 2018-12-14: 12 [IU] via SUBCUTANEOUS
  Filled 2018-12-13: qty 0.12

## 2018-12-13 MED ORDER — ASPIRIN 325 MG PO TABS
325.0000 mg | ORAL_TABLET | Freq: Every day | ORAL | Status: DC
  Administered 2018-12-13 – 2018-12-14 (×2): 325 mg via ORAL
  Filled 2018-12-13 (×2): qty 1

## 2018-12-13 MED ORDER — POTASSIUM CHLORIDE CRYS ER 20 MEQ PO TBCR
40.0000 meq | EXTENDED_RELEASE_TABLET | ORAL | Status: AC
  Administered 2018-12-13 (×2): 40 meq via ORAL
  Filled 2018-12-13 (×2): qty 2

## 2018-12-13 MED ORDER — VITAMIN D3 25 MCG (1000 UNIT) PO TABS
2000.0000 [IU] | ORAL_TABLET | Freq: Every day | ORAL | Status: DC
  Administered 2018-12-13 – 2018-12-14 (×2): 2000 [IU] via ORAL
  Filled 2018-12-13 (×3): qty 2

## 2018-12-13 MED ORDER — GADOBUTROL 1 MMOL/ML IV SOLN
6.0000 mL | Freq: Once | INTRAVENOUS | Status: AC | PRN
  Administered 2018-12-13: 6 mL via INTRAVENOUS

## 2018-12-13 NOTE — Progress Notes (Signed)
PROGRESS NOTE    SHAQUILL ISEMAN  IWP:[id number] DOB: [birth year] DOA: 12/10/2018 PCP: Josetta Huddle, MD   Brief Narrative:  KENTARIUS PARTINGTON is Vanissa Strength 68 y.o. male with medical history significant of diabetes, HTN, CVA, HLD presenting with about 3 weeks of generalized weakness.  Pt notes his sx started about 3 weeks ago.  He states he's just felt progressively weak and dizzy.  No episodes of syncope.  He notes his sx have gotten worse.  No fever, chills, cough, cp, sob, nausea, vomiting, abdominal pain.  No sick contacts.  He's lost about 4 lbs over this period of time.  He's had decreased appetite, but increased thirst and UOP.  He denies Starsha Morning hx of diabetes, but it looks like he had a1c of 6.8 in 2015 based on chart review.  ED Course: In ED he received EKG, IVF, calcium gluconate, bicarb, insulin gtt.  Hospitalist called to admit.  Assessment & Plan:   Active Problems:   DKA (diabetic ketoacidoses) ([place])   AKI (acute kidney injury) ([place])   HTN (hypertension)   HLD (hyperlipidemia)   Hyperkalemia   QT prolongation   Hypothermia   Lactic acidosis  DKA   HHS   Diabetes: Denies hx of DM, but had A1c of 6.8 in 2015.  BG at presentation over 1200 and calc osms 379.  AG is 31 with elevated betahydroxybutyric acid as well. Gap closed, pt now transitioned to lantus 12 units with 3 units mealtime and SSI.  D5 stopped today, will need to follow BG off D5. Serum osm 418 at presentation No source of infection identified, will d/c abx  Diabetes educator  Follow c peptide, insulin antibodies, anti gad, and anti islet cell  Pancreatitis   Elevated Lipase: DKA can sometimes cause hypertriglyceridemia leading to pancreatitis, though no evidence of hypertriglyceridemia (though triglycerides obtained after insulin started).  No etoh.  CT was without calcified gallstones and no biliary ductal dilatation.  No obvious medications. CT abdomen/pelvis 7/19 with focal acute pancreatitis involving the tail of the  pancreas, diffuse hepatic steatosis with indeterminate 1.2 cm mass of L lobe of liver.  Also indeterminate 1.6 cm mass of R kidney.  Moderate prostate gland enlargement with heterogenous enhancement of the prostate gland.  Bilateral lower lobe bronchiectasis.  Small ascites. Normal triglycerides.  Normal LFT's and bili.  No etoh. Follow RUQ [place]  Indeterminate R kidney mass and Liver mass: follow MRI  Enlarged Prostate: follow PSA per rads recs  Acute Kidney Injury: baseline in 2019 was 1.04.  Peaked to 4.11 here on admission.  Likely 2/2 dehydration from above. Improving, continue IVF Follow UA and renal US -> evidence of chronic medical renal disease Improved Strict I/O, daily weights  Hypernatremia: improved.  Continue to monitor.  Stop D5w.  Hypokalemia: replace and follow   Anion Gap Metabolic Acidosis   Lactic Acidosis: resolved  Systemic Inflammatory Response Syndrome   Hypothermia: no clear source of infection, but with hypothermia, tachycardia, elevated WBC and soft BP will start vancomycin and cefepime. Follow blood and urine cultures (NGTD).  Follow UA - not c/w UTI Follow CXR - negative COVID 19 test - negative, though he has test pending as well Will d/c abx and monitor  Nonsustained Vtach: occurred on tele while I was at bedside.   EF 60-65% (see report) Consider adding beta blocker if recurrent  Prolonged QTc:  improved  Hypertension: continue amlodipine, continue to hold ARB  HLD: restart simvastatin 20 mg   Hx CVA: continue  ASA, statin   Abnormal Thyroid Function tests: low TSH. normal Free T4, follow repeat outpatient outside of acute illness.  DVT prophylaxis: heparin Code Status: full  Family Communication: none at bedside - discussed with son Disposition Plan: pending further improvement   Consultants:   none  Procedures:  Echo IMPRESSIONS    1. The left ventricle has normal systolic function with an ejection fraction of 60-65%.  The cavity size was normal. Left ventricular diastolic parameters were normal.  2. The right ventricle has normal systolic function. The cavity was normal.  3. The mitral valve is grossly normal. Mild thickening of the mitral valve leaflet.  4. The tricuspid valve is grossly normal.  5. The aortic valve was not well visualized. No stenosis of the aortic valve.  6. The aortic root is normal in size and structure.  7. Normal LV function.  Antimicrobials: Anti-infectives (From admission, onward)   Start     Dose/Rate Route Frequency Ordered Stop   12/10/18 1730  ceFEPIme (MAXIPIME) 1 g in sodium chloride 0.9 % 100 mL IVPB     1 g 200 mL/hr over 30 Minutes Intravenous  Once 12/10/18 1710 12/10/18 2131   12/10/18 1730  vancomycin (VANCOCIN) 1,250 mg in sodium chloride 0.9 % 250 mL IVPB     1,250 mg 166.7 mL/hr over 90 Minutes Intravenous  Once 12/10/18 1710 12/10/18 1947       Subjective: Feeling progressively better  Objective: Vitals:   12/13/18 0500 12/13/18 0600 12/13/18 0700 12/13/18 0800  BP: 125/75 119/68 135/72   Pulse: 71 68 77   Resp: 20 14 14    Temp:    97.8 F (36.6 C)  TempSrc:    Oral  SpO2: 95% 99% 100%   Weight: 67.6 kg     Height:        Intake/Output Summary (Last 24 hours) at 12/13/2018 1221 Last data filed at 12/13/2018 0400 Gross per 24 hour  Intake 3061.51 ml  Output 2050 ml  Net 1011.51 ml   Filed Weights   12/11/18 0500 12/12/18 0500 12/13/18 0500  Weight: 63 kg 65.5 kg 67.6 kg    Examination:  General: No acute distress. Cardiovascular: Heart sounds show Caria Transue regular rate, and rhythm. Lungs: Clear to auscultation bilaterally Abdomen: Soft, nontender, nondistended  Neurological: Alert and oriented 3. Moves all extremities 4 . Cranial nerves II through XII grossly intact. Skin: Warm and dry. No rashes or lesions. Extremities: No clubbing or cyanosis. No edema.   Data Reviewed: I have personally reviewed following labs and imaging  studies  CBC: Recent Labs  Lab 12/10/18 1123 12/10/18 1413 12/11/18 0513 12/12/18 0243 12/13/18 0145  WBC 13.4* 18.2* 17.0* 12.2* 8.5  NEUTROABS 11.3* 15.3*  --   --   --   HGB 12.5* 12.6* 11.6* 11.2* 9.8*  HCT 43.3 43.7 39.3 37.5* 33.5*  MCV 76.0* 77.3* 72.9* 73.4* 73.6*  PLT 233 232 195 172 520*   Basic Metabolic Panel: Recent Labs  Lab 12/11/18 1154 12/11/18 1547 12/12/18 0243 12/12/18 1638 12/13/18 0145  NA 155* 155* 153* 145 143  K 4.2 4.3 3.7 3.1* 3.2*  CL 119* 119* 116* 107 107  CO2 26 24 28 27 27   GLUCOSE 270* 307* 275* 199* 248*  BUN 50* 41* 27* 18 16  CREATININE 1.95* 1.63* 1.32* 1.12 1.10  CALCIUM 9.1 9.3 8.7* 8.2* 8.0*  MG  --   --  2.2  --  2.0   GFR: Estimated Creatinine Clearance: 52.3 mL/min (by  C-G formula based on SCr of 1.1 mg/dL). Liver Function Tests: Recent Labs  Lab 12/10/18 1123 12/11/18 0513 12/12/18 0243 12/13/18 0145  AST 12* 18 33 39  ALT 23 20 22 27   ALKPHOS 88 78 75 71  BILITOT 1.5* 0.2* 0.4 0.3  PROT 7.1 6.5 6.0* 5.3*  ALBUMIN 3.5 3.5 2.9* 2.6*   Recent Labs  Lab 12/11/18 1154 12/12/18 0243  LIPASE 974* 370*   No results for input(s): AMMONIA in the last 168 hours. Coagulation Profile: No results for input(s): INR, PROTIME in the last 168 hours. Cardiac Enzymes: Recent Labs  Lab 12/10/18 1830  CKTOTAL 107   BNP (last 3 results) No results for input(s): PROBNP in the last 8760 hours. HbA1C: Recent Labs    12/11/18 0630  HGBA1C 16.8*   CBG: Recent Labs  Lab 12/12/18 1124 12/12/18 1603 12/12/18 2102 12/13/18 0750 12/13/18 1205  GLUCAP 202* 225* 238* 257* 213*   Lipid Profile: Recent Labs    12/11/18 1547 12/12/18 0243  CHOL  --  102  HDL  --  55  LDLCALC  --  32  TRIG 65 73  CHOLHDL  --  1.9   Thyroid Function Tests: Recent Labs    12/11/18 1547 12/13/18 0145  TSH 0.230*  --   FREET4  --  0.78   Anemia Panel: No results for input(s): VITAMINB12, FOLATE, FERRITIN, TIBC, IRON, RETICCTPCT  in the last 72 hours. Sepsis Labs: Recent Labs  Lab 12/10/18 1650 12/10/18 1806 12/12/18 0243  LATICACIDVEN 2.5* 2.2* 1.0    Recent Results (from the past 240 hour(s))  Novel Coronavirus, NAA (hospital order; send-out to ref lab)     Status: None   Collection Time: 12/10/18  9:54 AM   Specimen: Nasopharyngeal Swab; Respiratory  Result Value Ref Range Status   SARS-CoV-2, NAA NOT DETECTED NOT DETECTED Final    Comment: (NOTE) This test was developed and its performance characteristics determined by [employer]. This test has not been FDA cleared or approved. This test has been authorized by FDA under an Emergency Use Authorization (EUA). This test is only authorized for the duration of time the declaration that circumstances exist justifying the authorization of the emergency use of in vitro diagnostic tests for detection of SARS-CoV-2 virus and/or diagnosis of COVID-19 infection under section 564(b)(1) of the Act, 21 U.S.C. 158XEN-4(M)(7), unless the authorization is terminated or revoked sooner. When diagnostic testing is negative, the possibility of Jareb Radoncic false negative result should be considered in the context of See Beharry patient's recent exposures and the presence of clinical signs and symptoms consistent with COVID-19. An individual without symptoms of COVID-19 and who is not shedding SARS-CoV-2 virus would expect to have Senan Urey negative (not detected) result in this assay. Performed  At: [facility] [address] [place], [place] [address] Rush Farmer MD PR:[phone]    [place]  Final    Comment: Performed at [facility], [place] [address] [place], [place] [address]  Blood culture (routine x 2)     Status: None (Preliminary result)   Collection Time: 12/10/18  4:50 PM   Specimen: BLOOD  Result Value Ref Range Status   Specimen Description   Final    BLOOD RIGHT ANTECUBITAL Performed at [place]  [address] [place], [place] [address]    Special Requests   Final    BOTTLES DRAWN AEROBIC AND ANAEROBIC Blood Culture adequate volume Performed at [facility]  [address] [place], [place] [address]    Culture   Final    NO GROWTH 3 DAYS Performed at [facility], [place] [address], [place], [place] [address]    Report Status PENDING  Incomplete  SARS Coronavirus 2 (CEPHEID - Performed in [place] hospital lab), Hosp Order     Status: None   Collection Time: 12/10/18  5:32 PM   Specimen: Nasopharyngeal Swab  Result Value Ref Range Status   SARS Coronavirus 2 NEGATIVE NEGATIVE Final    Comment: (NOTE) If result is NEGATIVE SARS-CoV-2 target nucleic acids are NOT DETECTED. The SARS-CoV-2 RNA is generally detectable in upper and lower  respiratory specimens during the acute phase of infection. The lowest  concentration of SARS-CoV-2 viral copies this assay can detect is 250  copies / mL. Shaylynn Nulty negative result does not preclude SARS-CoV-2 infection  and should not be used as the sole basis for treatment or other  patient management decisions.  Melea Prezioso negative result may occur with  improper specimen collection / handling, submission of specimen other  than nasopharyngeal swab, presence of viral mutation(s) within the  areas targeted by this assay, and inadequate number of viral copies  (<250 copies / mL). Kristen Fromm negative result must be combined with clinical  observations, patient history, and epidemiological information. If result is POSITIVE SARS-CoV-2 target nucleic acids are DETECTED. The SARS-CoV-2 RNA is generally detectable in upper and lower  respiratory specimens dur ing the acute phase of infection.  Positive  results are indicative of active infection with SARS-CoV-2.  Clinical  correlation with patient history and other diagnostic information is  necessary to determine patient infection status.  Positive results do  not rule out bacterial infection or  co-infection with other viruses. If result is PRESUMPTIVE POSTIVE SARS-CoV-2 nucleic acids MAY BE PRESENT.   Valinda Fedie presumptive positive result was obtained on the submitted specimen  and confirmed on repeat testing.  While 2019 novel coronavirus  (SARS-CoV-2) nucleic acids may be present in the submitted sample  additional confirmatory testing may be necessary for epidemiological  and / or clinical management purposes  to differentiate between  SARS-CoV-2 and other Sarbecovirus currently known to infect humans.  If clinically indicated additional testing with an alternate test  methodology [phone]) is advised. The SARS-CoV-2 RNA is generally  detectable in upper and lower respiratory sp ecimens during the acute  phase of infection. The expected result is Negative. Fact Sheet for Patients:  StrictlyIdeas.no Fact Sheet for Healthcare Providers: BankingDealers.co.za This test is not yet approved or cleared by the [place] FDA and has been authorized for detection and/or diagnosis of SARS-CoV-2 by FDA under an Emergency Use Authorization (EUA).  This EUA will remain in effect (meaning this test can be used) for the duration of the COVID-19 declaration under Section 564(b)(1) of the Act, 21 U.S.C. section 360bbb-3(b)(1), unless the authorization is terminated or revoked sooner. Performed at [facility], [place] [address] [place], [place] [address]   Blood culture (routine x 2)     Status: None (Preliminary result)   Collection Time: 12/10/18  6:06 PM   Specimen: BLOOD  Result Value Ref Range Status   Specimen Description   Final    BLOOD LEFT ANTECUBITAL Performed at [place] [address] [place], [place] [address]    Special Requests   Final    BOTTLES DRAWN AEROBIC AND ANAEROBIC Blood Culture adequate volume Performed at [place] [address] [place],  [place]  [address]    Culture   Final    NO GROWTH 3 DAYS Performed at [facility], [place] [address] [place], [place] [address]    Report Status PENDING  Incomplete  Culture, Urine     Status: Abnormal   Collection Time: 12/10/18  6:22 PM   Specimen: Urine, Clean Catch  Result Value Ref Range Status   Specimen Description   Final    URINE, CLEAN CATCH Performed at [facility], [place] [address] [place], [place] [address]    Special Requests   Final    NONE Performed at [facility], [place] [address], [place], [place] [address]    Culture (Phyllip Claw)  Final    <10,000 COLONIES/mL INSIGNIFICANT GROWTH Performed at [place] [address] [place], [place] [address]    Report Status 12/12/2018 FINAL  Final  MRSA PCR Screening     Status: None   Collection Time: 12/13/18  3:34 AM   Specimen: Nasal Mucosa; Nasopharyngeal  Result Value Ref Range Status   MRSA by PCR NEGATIVE NEGATIVE Final    Comment:        The GeneXpert MRSA Assay (FDA approved for NASAL specimens only), is one component of Mariella Blackwelder comprehensive MRSA colonization surveillance program. It is not intended to diagnose MRSA infection nor to guide or monitor treatment for MRSA infections. Performed at [facility], [place] [address] [place], [place] [address]          Radiology Studies: Ct Abdomen Pelvis W Contrast  Result Date: 12/12/2018 CLINICAL DATA:  68 year old presenting with three-week history progressively worsening generalized weakness and now with dehydration. Current history of diabetes and hypertension. Elevated lipase on current laboratory data. EXAM: CT ABDOMEN AND PELVIS WITH CONTRAST TECHNIQUE: Multidetector CT imaging of the abdomen and pelvis was performed using the standard protocol following bolus administration of intravenous contrast. CONTRAST:  1105m OMNIPAQUE IOHEXOL 300 MG/ML IV. COMPARISON:  None. FINDINGS: Lower chest: Mild dependent  atelectasis in the lower lobes, LEFT greater than RIGHT. BILATERAL lower lobe bronchiectasis. Visualized lung bases otherwise clear. Normal heart size. Hepatobiliary: Diffuse hepatic steatosis. Indeterminate approximate 1.2 cm mass involving the MEDIAL segment LEFT lobe at the dome, Hounsfield measurements approximately 45. No other hepatic parenchymal masses. Mild diffuse hepatic steatosis. Gallbladder normal in appearance without calcified gallstones. No biliary ductal dilation. Pancreas: Edema/inflammation focally adjacent to the tail of the pancreas. Normal pancreatic enhancement. No pancreatic masses. Spleen: Normal in size and appearance. Adrenals/Urinary Tract: Normal appearing adrenal glands. Indeterminate approximate 1.6 cm mass arising from the mid RIGHT kidney posteriorly with Raequan Vanschaick Hounsfield measurement of approximately 50. Benign cortical cysts in the LEFT kidney. No hydronephrosis. No urinary tract calculi. Normal appearing urinary bladder. Stomach/Bowel: Prominent gastric folds diffusely, though the stomach is decompressed. Normal-appearing small bowel. Sigmoid colon mildly tortuous. Moderate stool burden in the descending colon, sigmoid colon and rectum. No focal abnormalities involving the colon. Normal-appearing appendix in the RIGHT mid pelvis. Vascular/Lymphatic: Mild aortoiliac atherosclerosis without evidence of aneurysm. Normal-appearing portal venous and systemic venous systems. No pathologic lymphadenopathy. Reproductive: Moderate prostate gland enlargement with heterogeneous enhancement of the prostate gland. Normal seminal vesicles. Other: Small amount of ascites dependently in the pelvis. Musculoskeletal: Mild degenerative changes involving the LOWER thoracic and lumbar spine. No acute findings. IMPRESSION: 1. Focal acute pancreatitis involving the tail of the pancreas. No evidence of pancreatic necrosis. 2. Diffuse hepatic steatosis. Indeterminate approximate 1.2 cm mass involving the  MEDIAL segment  LEFT lobe of the liver at the dome. 3. Indeterminate approximate 1.6 cm mass arising from the mid RIGHT kidney. 4. Moderate prostate gland enlargement with heterogeneous enhancement of the prostate gland. Please correlate with PSA. 5. BILATERAL lower lobe bronchiectasis. 6. Small amount of ascites dependently in the pelvis. MRI of the abdomen without and with contrast is recommended in further evaluation to characterize the indeterminate liver mass and RIGHT renal mass. This recommendation follows ACR consensus guidelines: Management of the Incidental Renal Mass on CT: Amiera Herzberg White Paper of the ACR Incidental Findings Committee. J Am Coll Radiol 2018;15:264-273, and Management of Incidental Liver Lesions on CT: Alezandra Egli White Paper of the ACR Incidental Findings Committee. J Am Coll Radiol 2017; 40:9811-9147. Aortic Atherosclerosis (ICD10-I70.0). Electronically Signed   By: Evangeline Dakin M.D.   On: 12/12/2018 15:43        Scheduled Meds:  Chlorhexidine Gluconate Cloth  6 each Topical Daily   feeding supplement (PRO-STAT SUGAR FREE 64)  30 mL Oral BID   heparin  5,000 Units Subcutaneous Q8H   insulin aspart  0-5 Units Subcutaneous QHS   insulin aspart  0-9 Units Subcutaneous TID WC   insulin aspart  3 Units Subcutaneous TID WC   [START ON 12/14/2018] insulin glargine  12 Units Subcutaneous Daily   insulin starter kit- pen needles  1 kit Other Once   multivitamin with minerals  1 tablet Oral Daily   polyethylene glycol  17 g Oral Daily   potassium chloride  40 mEq Oral Q4H   Ensure Max Protein  11 oz Oral Daily   Continuous Infusions:    LOS: 3 days    Time spent: over 30 min    Fayrene Helper, MD [facility] Pager AMION  If 7PM-7AM, please contact night-coverage [email or web address] Password [email or web address] 12/13/2018, 12:21 PM

## 2018-12-13 NOTE — Progress Notes (Signed)
Inpatient Diabetes Program Recommendations  AACE/ADA: New Consensus Statement on Inpatient Glycemic Control (2015)  Target Ranges:  Prepandial:   less than 140 mg/dL      Peak postprandial:   less than 180 mg/dL (1-2 hours)      Critically ill patients:  140 - 180 mg/dL   Spoke with patient about new diabetes diagnosis.  Discussed A1C results (16.8%) and explained what an A1C is. Discussed basic pathophysiology of DM Type 2, basic home care, importance of checking CBGs and maintaining good CBG control to prevent long-term and short-term complications. Reviewed glucose and A1C goals.    Reviewed signs and symptoms of hyperglycemia and hypoglycemia along with treatment for both.  Discussed impact of nutrition, exercise, stress, sickness, and medications on diabetes control.  Reviewed Living Well with diabetes booklet and encouraged patient to read through entire book. Patient reports reading his book yesterday.  Informed patient that he will be prescribed insulin at time of d/c. Patient is wanting to ask the attending physician questions about that.   Regarding patient insurance, Nancee Liter is preferred basal insulin at time of d/c.  Asked patient to check his glucose 1-2 times per day (fasting at least and possibly alternating second check)  Patient to watch insulin pen instructional video with demop insulin pen on the floor. Already spoke with patient about using insulin pen. Will also attach step by step instructions on insulin pen use in d/c paperwork.   Patient verbalized understanding of information discussed and he states that he has no further questions at this time related to diabetes.   RNs to provide ongoing basic DM education at bedside with this patient and engage patient to actively check blood glucose and administer insulin injections.   Thanks, Tama Headings RN, MSN, BC-ADM Inpatient Diabetes Coordinator Team Pager [phone] (8a-5p)

## 2018-12-13 NOTE — Progress Notes (Signed)
Physical Therapy Treatment Patient Details Name: Edward MorrowWillie L Morrow MRN: [id number] DOB: [birth year] Today's Date: 12/13/2018    History of Present Illness 68 yo male admitted with DKA, weakness. Hx of DM, CVA    PT Comments    Progressing with mobility. Recommend daily ambulation in hallway with nursing supervision to increase activity.    Follow Up Recommendations  Supervision for mobility/OOB(pt declines HHPT f/u)     Equipment Recommendations  Cane    Recommendations for Other Services       Precautions / Restrictions Precautions Precautions: Fall Restrictions Weight Bearing Restrictions: No    Mobility  Bed Mobility Overal bed mobility: Needs Assistance Bed Mobility: Supine to Sit     Supine to sit: Supervision;HOB elevated     General bed mobility comments: for safety, lines  Transfers Overall transfer level: Needs assistance   Transfers: Sit to/from Stand Sit to Stand: Min guard         General transfer comment: Close guard for safety.  Ambulation/Gait Ambulation/Gait assistance: Min guard Gait Distance (Feet): 200 Feet Assistive device: None Gait Pattern/deviations: Step-through pattern;Decreased stride length;Drifts right/left     General Gait Details: Close guard for safety. unsteady at times but no overt LOB. Pt tolerated distance well.   Stairs             Wheelchair Mobility    Modified Rankin (Stroke Patients Only)       Balance Overall balance assessment: Mild deficits observed, not formally tested                                          Cognition Arousal/Alertness: Awake/alert Behavior During Therapy: WFL for tasks assessed/performed Overall Cognitive Status: Within Functional Limits for tasks assessed                                        Exercises      General Comments        Pertinent Vitals/Pain Pain Assessment: No/denies pain    Home Living                      Prior Function            PT Goals (current goals can now be found in the care plan section) Progress towards PT goals: Progressing toward goals    Frequency    Min 3X/week      PT Plan Discharge plan needs to be updated    Co-evaluation              AM-PAC PT "6 Clicks" Mobility   Outcome Measure  Help needed turning from your back to your side while in a flat bed without using bedrails?: A Little Help needed moving from lying on your back to sitting on the side of a flat bed without using bedrails?: A Little Help needed moving to and from a bed to a chair (including a wheelchair)?: A Little Help needed standing up from a chair using your arms (e.g., wheelchair or bedside chair)?: A Little Help needed to walk in hospital room?: A Little Help needed climbing 3-5 steps with a railing? : A Little 6 Click Score: 18    End of Session Equipment Utilized During Treatment: Gait belt Activity Tolerance: Patient tolerated treatment well Patient  left: in chair;with call bell/phone within reach;with chair alarm set   PT Visit Diagnosis: Unsteadiness on feet (R26.81)     Time: 7829-5621 PT Time Calculation (min) (ACUTE ONLY): 13 min  Charges:  $Gait Training: 8-22 mins                        Edward Morrow, PT Acute Rehabilitation Services Pager: [phone] Office: [phone]

## 2018-12-14 DIAGNOSIS — K859 Acute pancreatitis without necrosis or infection, unspecified: Secondary | ICD-10-CM

## 2018-12-14 LAB — CBC
HCT: 36.2 % — ABNORMAL LOW (ref 39.0–52.0)
Hemoglobin: 10.8 g/dL — ABNORMAL LOW (ref 13.0–17.0)
MCH: 22.2 pg — ABNORMAL LOW (ref 26.0–34.0)
MCHC: 29.8 g/dL — ABNORMAL LOW (ref 30.0–36.0)
MCV: 74.5 fL — ABNORMAL LOW (ref 80.0–100.0)
Platelets: 130 10*3/uL — ABNORMAL LOW (ref 150–400)
RBC: 4.86 MIL/uL (ref 4.22–5.81)
RDW: 15 % (ref 11.5–15.5)
WBC: 7 10*3/uL (ref 4.0–10.5)
nRBC: 0 % (ref 0.0–0.2)

## 2018-12-14 LAB — PSA, TOTAL AND FREE
PSA, Free Pct: 11.2 %
PSA, Free: 0.28 ng/mL
Prostate Specific Ag, Serum: 2.5 ng/mL (ref 0.0–4.0)

## 2018-12-14 LAB — BASIC METABOLIC PANEL
Anion gap: 11 (ref 5–15)
BUN: 16 mg/dL (ref 8–23)
CO2: 24 mmol/L (ref 22–32)
Calcium: 8.6 mg/dL — ABNORMAL LOW (ref 8.9–10.3)
Chloride: 106 mmol/L (ref 98–111)
Creatinine, Ser: 0.96 mg/dL (ref 0.61–1.24)
GFR calc Af Amer: >60 mL/min (ref >60)
GFR calc non Af Amer: >60 mL/min (ref >60)
Glucose, Bld: 166 mg/dL — ABNORMAL HIGH (ref 70–99)
Potassium: 4.4 mmol/L (ref 3.5–5.1)
Sodium: 141 mmol/L (ref 135–145)

## 2018-12-14 LAB — GLUCOSE, CAPILLARY
Glucose-Capillary: 163 mg/dL — ABNORMAL HIGH (ref 70–99)
Glucose-Capillary: 170 mg/dL — ABNORMAL HIGH (ref 70–99)
Glucose-Capillary: 238 mg/dL — ABNORMAL HIGH (ref 70–99)

## 2018-12-14 LAB — ANTI-ISLET CELL ANTIBODY: Pancreatic Islet Cell Antibody: NEGATIVE

## 2018-12-14 MED ORDER — SIMVASTATIN 10 MG PO TABS: 10.0000 mg | ORAL_TABLET | Freq: Every day | ORAL | 0 refills | Status: AC

## 2018-12-14 MED ORDER — NOVOLIN N FLEXPEN RELION 100 UNIT/ML ~~LOC~~ SUPN: 5.0000 [IU] | PEN_INJECTOR | Freq: Two times a day (BID) | SUBCUTANEOUS | 0 refills | Status: DC

## 2018-12-14 MED ORDER — BLOOD GLUCOSE MONITOR KIT: PACK | 0 refills | Status: AC

## 2018-12-14 MED ORDER — AMLODIPINE BESYLATE 5 MG PO TABS: 5.0000 mg | ORAL_TABLET | Freq: Every day | ORAL | 0 refills | Status: DC

## 2018-12-14 MED ORDER — RELION PEN NEEDLES 32G X 4 MM MISC: 0 refills | Status: DC

## 2018-12-14 NOTE — Discharge Summary (Signed)
Physician Discharge Summary  Edward Morrow NGE:[id number] DOB: 05-23-1951 DOA: 12/10/2018  PCP: Edward Huddle, MD  Admit date: 12/10/2018 Discharge date: 12/14/2018  Time spent: 40 minutes  Recommendations for Outpatient Follow-up:  1. Follow up outpatient CBC/CMP 2. Follow up blood sugars as outpatient.  Pt discharged on NPH 5 units twice daily (relion brand due to cost).  Pt needs continued teaching with insulin and diabetes going forward.  Consider endocrine referral outpatient.  3. Follow blood pressure, valsartan d/c'd on discharge due to recent AKI 4. Statin decreased to 10 mg simvastatin daily, as LDL very low at 32 - follow outpatient 5. Pending insulin antibodies, anti gad, anti islet cell antibody at discharge. Low c peptide.  6. PSA 2.5 7. Abnormal TSH here, normal T4, follow repeat thyroid function tests outpatient  Discharge Diagnoses:  Active Problems:   DKA (diabetic ketoacidoses) ([place])   AKI (acute kidney injury) ([place])   HTN (hypertension)   HLD (hyperlipidemia)   Hyperkalemia   QT prolongation   Hypothermia   Lactic acidosis   Discharge Condition: stable  Diet recommendation: diabetic  Filed Weights   12/12/18 0500 12/13/18 0500 12/14/18 0520  Weight: 65.5 kg 67.6 kg 63 kg    History of present illness:  Edward Morrow is a 68 y.o. male with medical history significant of diabetes, HTN, CVA, HLD presenting with about 3 weeks of generalized weakness.  Pt notes his sx started about 3 weeks ago. He states he's just felt progressively weak and dizzy. No episodes of syncope. He notes his sx have gotten worse. No fever, chills, cough, cp, sob, nausea, vomiting, abdominal pain. No sick contacts. He's lost about 4 lbs over this period of time. He's had decreased appetite, but increased thirst and UOP. He denies a hx of diabetes, but it looks like he had a1c of 6.8 in 2015 based on chart review.  ED Course: In ED he received EKG, IVF, calcium gluconate, bicarb,  insulin gtt. Hospitalist called to admit.   He was admitted for DKA/HHS.  Treated with insulin with improvement in BG's.  He was found to be hypernatremic and also have pancreatitis.  He's improved to the time of discharge.    See below for further details  Hospital Course:  DKA  HHS  Diabetes: Denies hx of DM, but had A1c of 6.8 in 2015. BG at presentation over 1200 and calc osms 379. AG is 31 with elevated betahydroxybutyric acid as well. Gap closed, pt now transitioned to lantus 12 units with 3 units mealtime and SSI -> discharged on NPH relion brand at 5 units BID due to cost of long acting insulin as well as desire to discharge on simple regimen (he's had confusion with teaching).  Adjust as needed outpatient.  He'll need continued teaching outpatient.  Discussed with Edward Morrow who lives with him.  He has follow up soon on 7/27.  Fasting BG reasonable today.   Serum osm 418 at presentation No source of infection identified, will d/c abx  Diabetes educator  Follow c peptide, insulin antibodies, anti gad, and anti islet cell  Pancreatitis  Elevated Lipase: DKA can sometimes cause hypertriglyceridemia leading to pancreatitis, though no evidence of hypertriglyceridemia (though triglycerides obtained after insulin started).  No etoh.  CT was without calcified gallstones and no biliary ductal dilatation.  No obvious medications. CT abdomen/pelvis 7/19 with focal acute pancreatitis involving the tail of the pancreas, diffuse hepatic steatosis with indeterminate 1.2 cm mass of L lobe of liver.  Also  indeterminate 1.6 cm mass of R kidney.  Moderate prostate gland enlargement with heterogenous enhancement of the prostate gland.  Bilateral lower lobe bronchiectasis.  Small ascites. Normal triglycerides.  Normal LFT's and bili.  No etoh. Follow RUQ [place] - no gallstones, normal gallbladder and normal caliber cbd  Indeterminate R kidney mass and Liver mass: follow MRI -> notable for liver cyst and  proteinaceous vs hemorrhagic renal cyst.  Enlarged Prostate: PSA 2.5  Acute Kidney Injury: baseline in 2019 was 1.04. Peaked to 4.11 here on admission. Likely 2/2 dehydration from above. Resolved Follow UA and renal US -> evidence of chronic medical renal disease Improved Strict I/O, daily weights  Hypernatremia: improved.  Continue to monitor.  Stop D5w.  Hypokalemia: replace and follow   Anion Gap Metabolic Acidosis  Lactic Acidosis: resolved  Systemic Inflammatory Response Syndrome  Hypothermia: no clear source of infection, but with hypothermia, tachycardia, elevated WBC and soft BP will start vancomycin and cefepime. Follow blood and urine cultures (NGTD).  Follow UA - not c/w UTI Follow CXR - negative COVID 19 test - negative, though he has test pending as well Will d/c abx and monitor  Nonsustained Vtach:occurred on tele while I was at bedside.  EF 60-65% (see report) Consider adding beta blocker if recurrent  Prolonged QTc: improved  Hypertension: continue amlodipine, continue to hold ARB  HLD: restart simvastatin 20 mg -> decrease to 10 mg as LDL as 32  Hx CVA: continue ASA, statin   Abnormal Thyroid Function tests: low TSH. normal Free T4, follow repeat outpatient outside of acute illness.  Procedures: Echo IMPRESSIONS    1. The left ventricle has normal systolic function with an ejection fraction of 60-65%. The cavity size was normal. Left ventricular diastolic parameters were normal.  2. The right ventricle has normal systolic function. The cavity was normal.  3. The mitral valve is grossly normal. Mild thickening of the mitral valve leaflet.  4. The tricuspid valve is grossly normal.  5. The aortic valve was not well visualized. No stenosis of the aortic valve.  6. The aortic root is normal in size and structure.  7. Normal LV function.  Consultations:  none  Discharge Exam: Vitals:   12/14/18 0930 12/14/18 1347  BP: 120/71  106/74  Pulse: 93 89  Resp: 18 17  Temp: 98.3 F (36.8 C) 98.1 F (36.7 C)  SpO2: 100% 100%   Feels good. Ready for d/c home.  General: No acute distress. Cardiovascular: Heart sounds show a regular rate, and rhythm.  Lungs: Clear to auscultation bilaterally  Abdomen: Soft, nontender, nondistended  Neurological: Alert and oriented 3. Moves all extremities 4. Cranial nerves II through XII grossly intact. Skin: Warm and dry. No rashes or lesions. Extremities: No clubbing or cyanosis. No edema.   Discharge Instructions   Discharge Instructions    Ambulatory referral to Nutrition and Diabetic Education   Complete by: As directed    Call MD for:  difficulty breathing, headache or visual disturbances   Complete by: As directed    Call MD for:  extreme fatigue   Complete by: As directed    Call MD for:  hives   Complete by: As directed    Call MD for:  persistant dizziness or light-headedness   Complete by: As directed    Call MD for:  persistant nausea and vomiting   Complete by: As directed    Call MD for:  redness, tenderness, or signs of infection (pain, swelling, redness, odor or  green/yellow discharge around incision site)   Complete by: As directed    Call MD for:  severe uncontrolled pain   Complete by: As directed    Call MD for:  temperature >100.4   Complete by: As directed    Diet Carb Modified   Complete by: As directed    Discharge instructions   Complete by: As directed    You were seen for hyperglycemia (high blood sugars) which caused diabetic ketoacidosis (life threatening condition related to high blood sugars).    It is extremely important that you take insulin daily as prescribed or you risk developing a condition like this again.  We will plan to discharge you on the NPH flexpen 5 units twice daily (take with breakfast and dinner).  It's extremely important you follow up with your PCP in a few days to discuss your blood sugars and diabetes additionally  going forward.  They may talk about follow up with endocrine.  Check your blood sugars at least twice daily (but the more you check, the more data we have - you can check before meals and bedtime).  You also had pancreatitis.  The cause is not entirely clear, but this has improved now.    Your PSA was within normal limits.    You had cysts in your liver and kidney.  Your thyroid test was abnormal.  You'll need repeat testing outpatient.   I stopped your valstartan.  We've decreased your simvastatin.  Continue your aspirin and amlodipine.    Return for new, recurrent, or worsening symptoms.  Please ask your PCP to request records from this hospitalization so they know what was done and what the next steps will be.   Increase activity slowly   Complete by: As directed      Allergies as of 12/14/2018   No Known Allergies     Medication List    STOP taking these medications   valsartan 160 MG tablet Commonly known as: DIOVAN     TAKE these medications   amLODipine 5 MG tablet Commonly known as: NORVASC Take 1 tablet (5 mg total) by mouth daily.   aspirin 325 MG tablet Take 1 tablet (325 mg total) by mouth daily.   blood glucose meter kit and supplies Kit Dispense based on patient and insurance preference. Use up to four times daily as directed. (FOR ICD-9 250.00, 250.01).   NovoLIN N FlexPen ReliOn 100 UNIT/ML Kiwkpen Generic drug: Insulin NPH (Human) (Isophane) Inject 5 Units into the skin 2 (two) times daily with a meal.   ReliOn Pen Needles 32G X 4 MM Misc Generic drug: Insulin Pen Needle Use with relion flex pen for insulin   simvastatin 10 MG tablet Commonly known as: ZOCOR Take 1 tablet (10 mg total) by mouth daily. What changed:   medication strength  how much to take   VITAMIN D3 PO Take 2,000 Units by mouth daily.            Durable Medical Equipment  (From admission, onward)         Start     Ordered   12/14/18 1618  For home use only DME  Cane  Once     12/14/18 1617         No Known Allergies    The results of significant diagnostics from this hospitalization (including imaging, microbiology, ancillary and laboratory) are listed below for reference.    Significant Diagnostic Studies: Mr Abdomen W Wo Contrast  Result Date: 12/13/2018  CLINICAL DATA:  Liver and right renal mass. EXAM: MRI ABDOMEN WITHOUT AND WITH CONTRAST TECHNIQUE: Multiplanar multisequence MR imaging of the abdomen was performed both before and after the administration of intravenous contrast. CONTRAST:  7 cc Gadavist COMPARISON:  CT scan 12/12/2018 FINDINGS: Lower chest: Unremarkable. Hepatobiliary: Mild diffuse loss of signal intensity on out of phase T1 imaging. 1.1 cm T1 hypointense, T2 hyperintense lesion in the dome of the liver shows no enhancement after IV contrast administration, compatible with a cyst. Liver otherwise unremarkable. There is no evidence for gallstones, gallbladder wall thickening, or pericholecystic fluid. No intrahepatic or extrahepatic biliary dilation. Pancreas: As on recent CT scan, there appears to be a trace amount of edema in the region of the pancreatic tail. No dilatation of the main pancreatic duct. Spleen:  No splenomegaly. No focal mass lesion. Adrenals/Urinary Tract: No adrenal nodule or mass. 1.3 cm interpolar right renal lesion is homogeneous high signal intensity on T2 imaging and intermediate signal intensity on precontrast T1 imaging. After IV contrast administration, no enhancement is visible within the lesion. 9 mm lesion in the upper pole left kidney is homogeneously hypointense on T1 precontrast imaging and hyperintense on T2 imaging. No enhancement after IV contrast administration. Imaging features are compatible with a simple cyst. 7 mm simple cyst identified interpolar left kidney. Stomach/Bowel: Stomach is unremarkable. No gastric wall thickening. No evidence of outlet obstruction. Duodenum is normally positioned as  is the ligament of Treitz. No small bowel wall thickening. No small bowel or colonic dilatation within the visualized abdomen. Vascular/Lymphatic: No abdominal aortic aneurysm. No abdominal lymphadenopathy Other:  No intraperitoneal free fluid. Musculoskeletal: No abnormal marrow enhancement within the visualized bony anatomy. IMPRESSION: 1. 1.1 cm lesion in the dome of the liver without enhancement. Imaging features are most suggestive of a cyst. 2. 1.3 cm interpolar right renal lesion of question on recent CT is compatible with a proteinaceous or hemorrhagic cyst (Bosniak II). 3. Similar appearance of trace edema in the region of the pancreatic tail. 4. Hepatic steatosis. Electronically Signed   By: Misty Stanley M.D.   On: 12/13/2018 16:55   Ct Abdomen Pelvis W Contrast  Result Date: 12/12/2018 CLINICAL DATA:  68 year old presenting with three-week history progressively worsening generalized weakness and now with dehydration. Current history of diabetes and hypertension. Elevated lipase on current laboratory data. EXAM: CT ABDOMEN AND PELVIS WITH CONTRAST TECHNIQUE: Multidetector CT imaging of the abdomen and pelvis was performed using the standard protocol following bolus administration of intravenous contrast. CONTRAST:  152m OMNIPAQUE IOHEXOL 300 MG/ML IV. COMPARISON:  None. FINDINGS: Lower chest: Mild dependent atelectasis in the lower lobes, LEFT greater than RIGHT. BILATERAL lower lobe bronchiectasis. Visualized lung bases otherwise clear. Normal heart size. Hepatobiliary: Diffuse hepatic steatosis. Indeterminate approximate 1.2 cm mass involving the MEDIAL segment LEFT lobe at the dome, Hounsfield measurements approximately 45. No other hepatic parenchymal masses. Mild diffuse hepatic steatosis. Gallbladder normal in appearance without calcified gallstones. No biliary ductal dilation. Pancreas: Edema/inflammation focally adjacent to the tail of the pancreas. Normal pancreatic enhancement. No  pancreatic masses. Spleen: Normal in size and appearance. Adrenals/Urinary Tract: Normal appearing adrenal glands. Indeterminate approximate 1.6 cm mass arising from the mid RIGHT kidney posteriorly with a Hounsfield measurement of approximately 50. Benign cortical cysts in the LEFT kidney. No hydronephrosis. No urinary tract calculi. Normal appearing urinary bladder. Stomach/Bowel: Prominent gastric folds diffusely, though the stomach is decompressed. Normal-appearing small bowel. Sigmoid colon mildly tortuous. Moderate stool burden in the descending colon, sigmoid colon and  rectum. No focal abnormalities involving the colon. Normal-appearing appendix in the RIGHT mid pelvis. Vascular/Lymphatic: Mild aortoiliac atherosclerosis without evidence of aneurysm. Normal-appearing portal venous and systemic venous systems. No pathologic lymphadenopathy. Reproductive: Moderate prostate gland enlargement with heterogeneous enhancement of the prostate gland. Normal seminal vesicles. Other: Small amount of ascites dependently in the pelvis. Musculoskeletal: Mild degenerative changes involving the LOWER thoracic and lumbar spine. No acute findings. IMPRESSION: 1. Focal acute pancreatitis involving the tail of the pancreas. No evidence of pancreatic necrosis. 2. Diffuse hepatic steatosis. Indeterminate approximate 1.2 cm mass involving the MEDIAL segment LEFT lobe of the liver at the dome. 3. Indeterminate approximate 1.6 cm mass arising from the mid RIGHT kidney. 4. Moderate prostate gland enlargement with heterogeneous enhancement of the prostate gland. Please correlate with PSA. 5. BILATERAL lower lobe bronchiectasis. 6. Small amount of ascites dependently in the pelvis. MRI of the abdomen without and with contrast is recommended in further evaluation to characterize the indeterminate liver mass and RIGHT renal mass. This recommendation follows ACR consensus guidelines: Management of the Incidental Renal Mass on CT: A White  Paper of the ACR Incidental Findings Committee. J Am Coll Radiol 2018;15:264-273, and Management of Incidental Liver Lesions on CT: A White Paper of the ACR Incidental Findings Committee. J Am Coll Radiol 2017; 84:1324-4010. Aortic Atherosclerosis (ICD10-I70.0). Electronically Signed   By: Evangeline Dakin M.D.   On: 12/12/2018 15:43   US Renal  Result Date: 12/10/2018 CLINICAL DATA:  Acute kidney injury EXAM: RENAL / URINARY TRACT ULTRASOUND COMPLETE COMPARISON:  None FINDINGS: Right Kidney: Renal measurements: 9.0 x 5.0 x 5.7 cm = volume: 134 mL. 1.4 cm midpole cyst. Mildly increased echotexture. No hydronephrosis. Left Kidney: Renal measurements: 9.4 x 5.6 x 4.4 cm = volume: 121 mL. Upper pole cysts. Mildly increased echotexture. No hydronephrosis. Bladder: Appears normal for degree of bladder distention. IMPRESSION: Mildly increased echotexture in the kidneys bilaterally compatible with chronic medical renal disease. No acute findings. Electronically Signed   By: Rolm Baptise M.D.   On: 12/10/2018 18:18   Dg Chest Portable 1 View  Result Date: 12/10/2018 CLINICAL DATA:  New onset diabetes. EXAM: PORTABLE CHEST 1 VIEW COMPARISON:  None. FINDINGS: The heart size and mediastinal contours are within normal limits. Both lungs are clear. The visualized skeletal structures are unremarkable. IMPRESSION: No active disease. Electronically Signed   By: Marin Olp M.D.   On: 12/10/2018 17:51   US Abdomen Limited Ruq  Result Date: 12/13/2018 CLINICAL DATA:  Acute pancreatitis. EXAM: ULTRASOUND ABDOMEN LIMITED RIGHT UPPER QUADRANT COMPARISON:  CT scan 12/12/2010 FINDINGS: Gallbladder: No gallstones or wall thickening visualized. No sonographic Murphy sign noted by sonographer. Common bile duct: Diameter: 3.8 mm Liver: Small liver lesion at the dome, also noted on the recent CT scan. This is most likely a slightly complex cyst. Portal vein is patent on color Doppler imaging with normal direction of blood flow  towards the liver. IMPRESSION: 1. Normal gallbladder and normal caliber common bile duct. 2. 1.3 x 1.2 x 1.0 cm complex cyst at the liver dome. Electronically Signed   By: Marijo Sanes M.D.   On: 12/13/2018 17:44    Microbiology: Recent Results (from the past 240 hour(s))  Novel Coronavirus, NAA (hospital order; send-out to ref lab)     Status: None   Collection Time: 12/10/18  9:54 AM   Specimen: Nasopharyngeal Swab; Respiratory  Result Value Ref Range Status   SARS-CoV-2, NAA NOT DETECTED NOT DETECTED Final    Comment: (NOTE)  This test was developed and its performance characteristics determined by [employer]. This test has not been FDA cleared or approved. This test has been authorized by FDA under an Emergency Use Authorization (EUA). This test is only authorized for the duration of time the declaration that circumstances exist justifying the authorization of the emergency use of in vitro diagnostic tests for detection of SARS-CoV-2 virus and/or diagnosis of COVID-19 infection under section 564(b)(1) of the Act, 21 U.S.C. 193XTK-2(I)(0), unless the authorization is terminated or revoked sooner. When diagnostic testing is negative, the possibility of a false negative result should be considered in the context of a patient's recent exposures and the presence of clinical signs and symptoms consistent with COVID-19. An individual without symptoms of COVID-19 and who is not shedding SARS-CoV-2 virus would expect to have a negative (not detected) result in this assay. Performed  At: [facility] [address] [place], [place] [id number] Rush Farmer MD EQ:[id number]    [place]  Final    Comment: Performed at [facility], [place] [address], [place], [place] [address]  Blood culture (routine x 2)     Status: None (Preliminary result)   Collection Time: 12/10/18  4:50 PM   Specimen: BLOOD  Result Value Ref Range Status   Specimen  Description   Final    BLOOD RIGHT ANTECUBITAL Performed at [facility] [address] [place], [place] [address]    Special Requests   Final    BOTTLES DRAWN AEROBIC AND ANAEROBIC Blood Culture adequate volume Performed at [facility] [address], [place], [place] [address]    Culture   Final    NO GROWTH 4 DAYS Performed at [facility], [place] [address] [place], [place] [address]    Report Status PENDING  Incomplete  SARS Coronavirus 2 (CEPHEID - Performed in [place] hospital lab), Hosp Order     Status: None   Collection Time: 12/10/18  5:32 PM   Specimen: Nasopharyngeal Swab  Result Value Ref Range Status   SARS Coronavirus 2 NEGATIVE NEGATIVE Final    Comment: (NOTE) If result is NEGATIVE SARS-CoV-2 target nucleic acids are NOT DETECTED. The SARS-CoV-2 RNA is generally detectable in upper and lower  respiratory specimens during the acute phase of infection. The lowest  concentration of SARS-CoV-2 viral copies this assay can detect is 250  copies / mL. A negative result does not preclude SARS-CoV-2 infection  and should not be used as the sole basis for treatment or other  patient management decisions.  A negative result may occur with  improper specimen collection / handling, submission of specimen other  than nasopharyngeal swab, presence of viral mutation(s) within the  areas targeted by this assay, and inadequate number of viral copies  (<250 copies / mL). A negative result must be combined with clinical  observations, patient history, and epidemiological information. If result is POSITIVE SARS-CoV-2 target nucleic acids are DETECTED. The SARS-CoV-2 RNA is generally detectable in upper and lower  respiratory specimens dur ing the acute phase of infection.  Positive  results are indicative of active infection with SARS-CoV-2.  Clinical  correlation with patient history and other diagnostic information is  necessary  to determine patient infection status.  Positive results do  not rule out bacterial infection or co-infection with other viruses. If result is PRESUMPTIVE POSTIVE SARS-CoV-2 nucleic acids MAY BE PRESENT.   A presumptive positive result was obtained on the submitted specimen  and confirmed on repeat testing.  While 2019 novel coronavirus  (SARS-CoV-2) nucleic acids may be present in the submitted sample  additional confirmatory testing may be necessary for epidemiological  and / or clinical management purposes  to differentiate between  SARS-CoV-2 and other Sarbecovirus currently known to infect humans.  If clinically indicated additional testing with an alternate test  methodology [id number]) is advised. The SARS-CoV-2 RNA is generally  detectable in upper and lower respiratory sp ecimens during the acute  phase of infection. The expected result is Negative. Fact Sheet for Patients:  StrictlyIdeas.no Fact Sheet for Healthcare Providers: BankingDealers.co.za This test is not yet approved or cleared by the [place] FDA and has been authorized for detection and/or diagnosis of SARS-CoV-2 by FDA under an Emergency Use Authorization (EUA).  This EUA will remain in effect (meaning this test can be used) for the duration of the COVID-19 declaration under Section 564(b)(1) of the Act, 21 U.S.C. section 360bbb-3(b)(1), unless the authorization is terminated or revoked sooner. Performed at [facility], [place] [address], [place], [place] [address]   Blood culture (routine x 2)     Status: None (Preliminary result)   Collection Time: 12/10/18  6:06 PM   Specimen: BLOOD  Result Value Ref Range Status   Specimen Description   Final    BLOOD LEFT ANTECUBITAL Performed at [facility] [address] [place] , [place] [address]    Special Requests   Final    BOTTLES DRAWN AEROBIC AND ANAEROBIC Blood Culture  adequate volume Performed at [facility] [address] [place], [place] [address]    Culture   Final    NO GROWTH 4 DAYS Performed at [facility], [place] [address] [place], [place] [address]    Report Status PENDING  Incomplete  Culture, Urine     Status: Abnormal   Collection Time: 12/10/18  6:22 PM   Specimen: Urine, Clean Catch  Result Value Ref Range Status   Specimen Description   Final    URINE, CLEAN CATCH Performed at [facility], [place] [address] [place], [place] [address]    Special Requests   Final    NONE Performed at [facility], [place] [address], [place], [place] [address]    Culture (A)  Final    <10,000 COLONIES/mL INSIGNIFICANT GROWTH Performed at [facility] [address] [place], [place] [address]    Report Status 12/12/2018 FINAL  Final  MRSA PCR Screening     Status: None   Collection Time: 12/13/18  3:34 AM   Specimen: Nasal Mucosa; Nasopharyngeal  Result Value Ref Range Status   MRSA by PCR NEGATIVE NEGATIVE Final    Comment:        The GeneXpert MRSA Assay (FDA approved for NASAL specimens only), is one component of a comprehensive MRSA colonization surveillance program. It is not intended to diagnose MRSA infection nor to guide or monitor treatment for MRSA infections. Performed at [facility], [place] [address] [place], [place] [address]      Labs: Basic Metabolic Panel: Recent Labs  Lab 12/11/18 1547 12/12/18 0243 12/12/18 1638 12/13/18 0145 12/14/18 0818  NA 155* 153* 145 143 141  K 4.3 3.7 3.1* 3.2* 4.4  CL 119* 116* 107 107 106  CO2 _0 GLUCOSE 307* 275* 199* 248* 166*  BUN 41* 27* _1 CREATININE 1.63* 1.32* 1.12 1.10  0.96  CALCIUM 9.3 8.7* 8.2* 8.0* 8.6*  MG  --  2.2  --  2.0  --    Liver Function Tests: Recent Labs  Lab 12/10/18 1123 12/11/18 0513 12/12/18 0243 12/13/18 0145  AST 12* 18 33 39  ALT _0 ALKPHOS 88 78 75 71  BILITOT 1.5* 0.2* 0.4 0.3  PROT 7.1 6.5 6.0* 5.3*  ALBUMIN 3.5 3.5 2.9* 2.6*   Recent Labs  Lab 12/11/18 1154 12/12/18 0243  LIPASE 974* 370*   No results for input(s): AMMONIA in the last 168 hours. CBC: Recent Labs  Lab 12/10/18 1123 12/10/18 1413 12/11/18 0513 12/12/18 0243 12/13/18 0145 12/14/18 0818  WBC 13.4* 18.2* 17.0* 12.2* 8.5 7.0  NEUTROABS 11.3* 15.3*  --   --   --   --   HGB 12.5* 12.6* 11.6* 11.2* 9.8* 10.8*  HCT 43.3 43.7 39.3 37.5* 33.5* 36.2*  MCV 76.0* 77.3* 72.9* 73.4* 73.6* 74.5*  PLT 233 232 195 172 143* 130*   Cardiac Enzymes: Recent Labs  Lab 12/10/18 1830  CKTOTAL 107   BNP: BNP (last 3 results) No results for input(s): BNP in the last 8760 hours.  ProBNP (last 3 results) No results for input(s): PROBNP in the last 8760 hours.  CBG: Recent Labs  Lab 12/13/18 1636 12/13/18 2106 12/14/18 0723 12/14/18 1149 12/14/18 1707  GLUCAP 131* 166* 163* 170* 238*       Signed:  Fayrene Helper MD.  [facility] 12/14/2018, 6:14 PM

## 2018-12-14 NOTE — Care Management Important Message (Signed)
Important Message  Patient Details IM Letter given to Velva Harman RN to present to the Patient Name: Edward Morrow MRN: [id number] Date of Birth: November 28, 1950   Medicare Important Message Given:  Yes     Kerin Salen 12/14/2018, 10:22 AM

## 2018-12-14 NOTE — Progress Notes (Signed)
Discharge instructions discussed with patient and caregiver,verbalized agreement and understanding 

## 2018-12-14 NOTE — Progress Notes (Signed)
Inpatient Diabetes Program Recommendations  AACE/ADA: New Consensus Statement on Inpatient Glycemic Control (2015)  Target Ranges:  Prepandial:   less than 140 mg/dL      Peak postprandial:   less than 180 mg/dL (1-2 hours)      Critically ill patients:  140 - 180 mg/dL   Lab Results  Component Value Date   GLUCAP 170 (H) 12/14/2018   HGBA1C 16.8 (H) 12/11/2018    Saw patient an reviewed insulin pen and how /when to check glucose levels at home. Patient needed much reinforcement. We went over how to use the pen 3 times before he thought he could do it. Patient got checking his glucose and insulin injections confused.   Patient stated he has follow up with his PCP on the 27th. Patient said "he may decide to take me off of the medicine." Discussed with patient that his levels indicate his body does not make insulin so he will have to take it everyday or he will come back to the hospital very sick before his appointment. It was then that he really tried to operate the insulin pen.   For future reference patient will need very basic regimen and instruction. Dr. Florene Glen aware of patient plan of care.  Thanks, Tama Headings RN, MSN, BC-ADM Inpatient Diabetes Coordinator Team Pager [phone] (8a-5p)

## 2018-12-14 NOTE — Progress Notes (Signed)
Nutrition Note  RD consulted for nutrition education regarding diabetes.   Lab Results  Component Value Date   HGBA1C 16.8 (H) 12/11/2018   Spoke with patient on the phone.   RD providing "Carbohydrate Counting for People with Diabetes" handout from the Academy of Nutrition and Dietetics in discharge instructions. Discussed different food groups and their effects on blood sugar, emphasizing carbohydrate-containing foods. Provided list of carbohydrates and recommended serving sizes of common foods.  Discussed importance of controlled and consistent carbohydrate intake throughout the day. Provided examples of ways to balance meals/snacks and encouraged intake of high-fiber, whole grain complex carbohydrates. Teach back method used.  Expect fair compliance. Pt with good diet related questions and seems to want to change his habits.  Body mass index is 23.82 kg/m. Pt meets criteria for normal based on current BMI.  Current diet order is heart health/CHO modified , patient is consuming approximately 100% of meals at this time. Labs and medications reviewed. No further nutrition interventions warranted at this time. If additional nutrition issues arise, please re-consult RD.  Clayton Bibles, MS, RD, [place] Dietitian Pager: [phone] After Hours Pager: [phone]

## 2018-12-14 NOTE — Progress Notes (Signed)
Inpatient Diabetes Program Recommendations  AACE/ADA: New Consensus Statement on Inpatient Glycemic Control (2015)  Target Ranges:  Prepandial:   less than 140 mg/dL      Peak postprandial:   less than 180 mg/dL (1-2 hours)      Critically ill patients:  140 - 180 mg/dL   Lab Results  Component Value Date   GLUCAP 163 (H) 12/14/2018   HGBA1C 16.8 (H) 12/11/2018    Review of Glycemic Control  Diabetes history: New Diagnosis  Current orders for Inpatient glycemic control:  Lantus 12 units Daily Novolog 0-9 units tid  Novolog 0-5 units qhs Novolog 3 units tid meal coverage  Inpatient Diabetes Program Recommendations:    C-peptide 0.3, very low. 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   Patient will require insulin outpatient, however will need a simplified regimen as patient can easily get confused. Patient will most likely need Endocrinology outpatient for DM follow up.  Unsure which short acting insulin is covered by insurance.  D/c Blood glucose meter kit (order # [id number]) Kara Mead (order # [phone]) Insulin pen needles (order # [id number])  Thanks,  Tama Headings RN, MSN, BC-ADM Inpatient Diabetes Coordinator Team Pager [phone] (8a-5p)

## 2018-12-14 NOTE — TOC Benefit Eligibility Note (Signed)
Transition of Care [facility]) Benefit Eligibility Note    Patient Details  Name: Edward Morrow MRN: [id number] Date of Birth: 1950/06/03   Medication/Dose: asked to quote highest dosage  Covered?: Yes(Basaglar not covered Veda Canning and Levemer on formulary)     Prescription Coverage Preferred Pharmacy: local pharmacy  Spoke with Person/Company/Phone Number:: Marcus/ [employer] [phone] for [facility]  Co-Pay: Tyler Aas vile and pen both $142.00, Lantus vile and pen both $142.00, Toujeo vile and pen both $184.00 and Levemer both vile and pen $142  Prior Approval: No  Deductible: Met       Kerin Salen Phone Number: 12/14/2018, 3:46 PM

## 2018-12-14 NOTE — Progress Notes (Signed)
Physical Therapy Treatment Patient Details Name: Edward Morrow MRN: [id number] DOB: 1950/08/18 Today's Date: 12/14/2018    History of Present Illness 68 yo male admitted with DKA, weakness. Hx of DM, CVA    PT Comments    Progressing well with mobility. Plan is for d/c later today per pt.   Follow Up Recommendations  Supervision for mobility/OOB     Equipment Recommendations  Cane    Recommendations for Other Services       Precautions / Restrictions Precautions Precautions: Fall Restrictions Weight Bearing Restrictions: No    Mobility  Bed Mobility               General bed mobility comments: oob in recliner  Transfers Overall transfer level: Modified independent Equipment used: None                Ambulation/Gait Ambulation/Gait assistance: Min guard Gait Distance (Feet): 200 Feet Assistive device: Straight cane Gait Pattern/deviations: Step-through pattern;Decreased stride length     General Gait Details: for safety. No LOB observed.   Stairs             Wheelchair Mobility    Modified Rankin (Stroke Patients Only)       Balance Overall balance assessment: Mild deficits observed, not formally tested                                          Cognition Arousal/Alertness: Awake/alert Behavior During Therapy: WFL for tasks assessed/performed Overall Cognitive Status: Within Functional Limits for tasks assessed                                        Exercises      General Comments        Pertinent Vitals/Pain Pain Assessment: No/denies pain    Home Living                      Prior Function            PT Goals (current goals can now be found in the care plan section) Progress towards PT goals: Progressing toward goals    Frequency    Min 3X/week      PT Plan Current plan remains appropriate    Co-evaluation              AM-PAC PT "6 Clicks" Mobility    Outcome Measure  Help needed turning from your back to your side while in a flat bed without using bedrails?: None Help needed moving from lying on your back to sitting on the side of a flat bed without using bedrails?: None Help needed moving to and from a bed to a chair (including a wheelchair)?: None Help needed standing up from a chair using your arms (e.g., wheelchair or bedside chair)?: None Help needed to walk in hospital room?: A Little Help needed climbing 3-5 steps with a railing? : A Little 6 Click Score: 22    End of Session Equipment Utilized During Treatment: Gait belt Activity Tolerance: Patient tolerated treatment well Patient left: in chair;with call bell/phone within reach   PT Visit Diagnosis: Unsteadiness on feet (R26.81)     Time: 1329-1340 PT Time Calculation (min) (ACUTE ONLY): 11 min  Charges:  $Gait Training: 8-22 mins  Weston Anna, PT Acute Rehabilitation Services Pager: [phone] Office: [phone]

## 2018-12-14 NOTE — Discharge Instructions (Addendum)
Blood Glucose Monitoring, Adult Monitoring your blood sugar (glucose) is an important part of managing your diabetes (diabetes mellitus). Blood glucose monitoring involves checking your blood glucose as often as directed and keeping Edward Morrow record (log) of your results over time. Checking your blood glucose regularly and keeping Edward Morrow blood glucose log can:  Help you and your health care provider adjust your diabetes management plan as needed, including your medicines or insulin.  Help you understand how food, exercise, illnesses, and medicines affect your blood glucose.  Let you know what your blood glucose is at any time. You can quickly find out if you have low blood glucose (hypoglycemia) or high blood glucose (hyperglycemia). Your health care provider will set individualized treatment goals for you. Your goals will be based on your age, other medical conditions you have, and how you respond to diabetes treatment. Generally, the goal of treatment is to maintain the following blood glucose levels:  Before meals (preprandial): 80-130 mg/dL (4.4-7.2 mmol/L).  After meals (postprandial): below 180 mg/dL (10 mmol/L).  A1c level: less than 7%. Supplies needed:  Blood glucose meter.  Test strips for your meter. Each meter has its own strips. You must use the strips that came with your meter.  Edward Morrow needle to prick your finger (lancet). Do not use Edward Morrow lancet more than one time.  Edward Morrow device that holds the lancet (lancing device).  Edward Morrow journal or log book to write down your results. How to check your blood glucose  1. Wash your hands with soap and water. 2. Prick the side of your finger (not the tip) with the lancet. Use Edward Morrow different finger each time. 3. Gently rub the finger until Edward Morrow small drop of blood appears. 4. Follow instructions that come with your meter for inserting the test strip, applying blood to the strip, and using your blood glucose meter. 5. Write down your result and any notes. Some meters  allow you to use areas of your body other than your finger (alternative sites) to test your blood. The most common alternative sites are:  Forearm.  Thigh.  Palm of the hand. If you think you may have hypoglycemia, or if you have Edward Morrow history of not knowing when your blood glucose is getting low (hypoglycemia unawareness), do not use alternative sites. Use your finger instead. Alternative sites may not be as accurate as the fingers, because blood flow is slower in these areas. This means that the result you get may be delayed, and it may be different from the result that you would get from your finger. Follow these instructions at home: Blood glucose log   Every time you check your blood glucose, write down your result. Also write down any notes about things that may be affecting your blood glucose, such as your diet and exercise for the day. This information can help you and your health care provider: ? Look for patterns in your blood glucose over time. ? Adjust your diabetes management plan as needed.  Check if your meter allows you to download your records to Edward Morrow computer. Most glucose meters store Edward Morrow record of glucose readings in the meter. If you have type 1 diabetes:  Check your blood glucose 2 or more times Edward Morrow day.  Also check your blood glucose: ? Before every insulin injection. ? Before and after exercise. ? Before meals. ? 2 hours after Edward Morrow meal. ? Occasionally between 2:00 Edward Morrow.m. and 3:00 Edward Morrow.m., as directed. ? Before potentially dangerous tasks, like driving or using heavy  machinery. ? At bedtime.  You may need to check your blood glucose more often, up to 6-10 times Edward Morrow day, if you: ? Use an insulin pump. ? Need multiple daily injections (MDI). ? Have diabetes that is not well-controlled. ? Are ill. ? Have Edward Morrow history of severe hypoglycemia. ? Have hypoglycemia unawareness. If you have type 2 diabetes:  If you take insulin or other diabetes medicines, check your blood glucose 2 or  more times Edward Morrow day.  If you are on intensive insulin therapy, check your blood glucose 4 or more times Nima Bamburg day. Occasionally, you may also need to check between 2:00 Johnathen Testa.m. and 3:00 Lamika Connolly.m., as directed.  Also check your blood glucose: ? Before and after exercise. ? Before potentially dangerous tasks, like driving or using heavy machinery.  You may need to check your blood glucose more often if: ? Your medicine is being adjusted. ? Your diabetes is not well-controlled. ? You are ill. General tips  Always keep your supplies with you.  If you have questions or need help, all blood glucose meters have Myah Guynes 24-hour "hotline" phone number that you can call. You may also contact your health care provider.  After you use Bronte Sabado few boxes of test strips, adjust (calibrate) your blood glucose meter by following instructions that came with your meter. Contact Shaquaya Wuellner health care provider if:  Your blood glucose is at or above 240 mg/dL (16.113.3 mmol/L) for 2 days in Ibrahima Holberg row.  You have been sick or have had Brandis Wixted fever for 2 days or longer, and you are not getting better.  You have any of the following problems for more than 6 hours: ? You cannot eat or drink. ? You have nausea or vomiting. ? You have diarrhea. Get help right away if:  Your blood glucose is lower than 54 mg/dL (3 mmol/L).  You become confused or you have trouble thinking clearly.  You have difficulty breathing.  You have moderate or large ketone levels in your urine. Summary  Monitoring your blood sugar (glucose) is an important part of managing your diabetes (diabetes mellitus).  Blood glucose monitoring involves checking your blood glucose as often as directed and keeping Mete Purdum record (log) of your results over time.  Your health care provider will set individualized treatment goals for you. Your goals will be based on your age, other medical conditions you have, and how you respond to diabetes treatment.  Every time you check your blood glucose,  write down your result. Also write down any notes about things that may be affecting your blood glucose, such as your diet and exercise for the day. This information is not intended to replace advice given to you by your health care provider. Make sure you discuss any questions you have with your health care provider. Document Released: 05/15/2003 Document Revised: 03/05/2018 Document Reviewed: 10/22/2015 Elsevier Patient Education  2020 [employer]  Your A1c was 16.8% this admission Along with your elevated A1c your c-peptide levels were low at 0.3 indicating your pancreas is not making Ashaki Frosch lot of insulin and you may need to be on insulin everyday for now on.  We encourage you to see Wille Aubuchon diabetes specialist (Endocrinologist) outpatient as your TSH was also abnormal at 0.230.         Carbohydrate Counting for People with Diabetes Why Is Carbohydrate Counting Important? Counting carbohydrate servings may help you control your blood glucose level so that you feel better. The balance between the carbohydrates you eat and insulin  determines what your blood glucose level will be after eating. Carbohydrate counting can also help you plan your meals.  Which Foods Have Carbohydrates? Foods with carbohydrates include: Breads, crackers, and cereals Pasta, rice, and grains Starchy vegetables, such as potatoes, corn, and peas Beans and legumes Milk, soy milk, and yogurt Fruits and fruit juices Sweets, such as cakes, cookies, ice cream, jam, and jelly  Carbohydrate Servings In diabetes meal planning, 1 serving of Grazia Taffe food with carbohydrate has about 15 grams of carbohydrate: Check serving sizes with measuring cups and spoons or Ruth Tully food scale. Read the Nutrition Facts on food labels to find out how many grams of carbohydrate are in foods you eat. The food lists in this handout show portions that have about 15 grams of carbohydrate.  Meal Planning Tips An Eating Plan tells you how many  carbohydrate servings to eat at your meals and snacks. For many adults, eating 3 to 5 servings of carbohydrate foods at each meal and 1 or 2 carbohydrate servings for each snack works well. In Khamiya Varin healthy daily Eating Plan, most carbohydrates come from: At least 6 servings of fruits and nonstarchy vegetables At least 6 servings of grains, beans, and starchy vegetables, with at least 3 servings from whole grains At least 2 servings of milk or milk products Check your blood glucose level regularly. It can tell you if you need to adjust when you eat carbohydrates. Eating foods that have fiber, such as whole grains, and having very few salty foods is good for your health. Eat 4 to 6 ounces of meat or other protein foods (such as soybean burgers) each day.  Choose low-fat sources of protein, such as lean beef, lean pork, chicken, fish, low-fat cheese, or vegetarian foods such as soy. Eat some healthy fats, such as olive oil, canola oil, and nuts. Eat very little saturated fats. These unhealthy fats are found in butter, cream, and high-fat meats, such as bacon and sausage. Eat very little or no trans fats. These unhealthy fats are found in all foods that list partially hydrogenated oil as an Ingredient.  Label Reading Tips The Nutrition Facts panel on Gunnar Hereford label lists the grams of total carbohydrate in 1 standard serving.  The labels standard serving may be larger or smaller than 1 carbohydrate serving. To figure out how many carbohydrate servings are in the food: First, look at the labels standard serving size. Check the grams of total carbohydrate. This is the amount of carbohydrate in 1 standard serving. Divide the grams of total carbohydrate by 15. This number equals the number of carbohydrate servings in 1 standard serving.  Remember: 1 carbohydrate serving is 15 grams of carbohydrate. Note: You may ignore the grams of sugars on the Nutrition Facts panel because they are included in the grams  of total carbohydrate.  Foods Recommended 1 serving = about 15 grams of carbohydrate  Starches 1 slice bread (1 ounce) 1 tortilla (6-inch size)  large bagel (1 ounce) 2 taco shells (5-inch size)  hamburger or hot dog bun ( ounce)  cup ready-to-eat unsweetened cereal  cup cooked cereal 1 cup broth-based soup 4 to 6 small crackers 1/3 cup pasta or rice (cooked)  cup beans, peas, corn, sweet potatoes, winter squash, or mashed or boiled potatoes (cooked)  large baked potato (3 ounces)  ounce pretzels, potato chips, or tortilla chips 3 cups popcorn (popped)  Fruit 1 small fresh fruit ( to 1 cup)  cup canned or frozen fruit 2 tablespoons dried fruit (  blueberries, cherries, cranberries, mixed fruit, raisins) 17 small grapes (3 ounces) 1 cup melon or berries  cup unsweetened fruit juice  Milk 1 cup fat-free or reduced-fat milk 1 cup soy milk 2/3 cup (6 ounces) nonfat yogurt sweetened with sugar-free sweetener  Sweets and Desserts 2-inch square cake (unfrosted) 2 small cookies (2/3 ounce)  cup ice cream or frozen yogurt  cup sherbet or sorbet 1 tablespoon syrup, jam, jelly, table sugar, or honey 2 tablespoons light syrup  Other Foods Count 1 cup raw vegetables or  cup cooked nonstarchy vegetables as zero (0) carbohydrate servings or free foods. If you eat 3 or more servings at one meal, count them as 1 carbohydrate serving. Foods that have less than 20 calories in each serving also may be counted as zero carbohydrate servings or free foods. Count 1 cup of casserole or other mixed foods as 2 carbohydrate servings.  Carbohydrate Counting for People with Diabetes Sample 1-Day Menu Breakfast 1 extra-small banana (1 carbohydrate serving) 3/4 cup corn flakes (1 carbohydrate serving) 1 cup low-fat or fat-free milk (1 carbohydrate serving) 1 slice whole wheat bread (1 carbohydrate serving) 1 teaspoon margarine Lunch 2 ounces Mal[place]key slices 2 slices whole  wheat bread (2 carbohydrate servings) 2 lettuce leaves 4 celery sticks 4 carrot sticks 1 medium apple (1 carbohydrate serving) 1 cup low-fat or fat-free milk (1 carbohydrate serving) Afternoon Snack 2 tablespoons raisins (1 carbohydrate serving) 3/4 ounce unsalted mini pretzels (1 carbohydrate serving) Evening Meal 3 ounces lean roast beef 1/2 large baked potato (2 carbohydrate servings) 1 tablespoon reduced-fat sour cream 1/2 cup green beans 1 cup vegetable salad 1 tablespoon light salad dressing 1 whole wheat dinner roll (1 carbohydrate serving) 1 teaspoon margarine 1 cup melon balls (1 carbohydrate serving) Evening Snack 6 ounces low-fat sugar-free fruit yogurt (1 carbohydrate serving) 2 tablespoons unsalted nuts  Diabetes Label Reading Tips Check the Nutrition Facts on food labels for nutrient information for the food. The Nutrition Facts information is based on Annalese Stiner standard serving size.  However, that serving size may not be the same as the serving size used in carbohydrate counting. Always start by checking the serving size on the label. Is this the serving size you will be eating?  How many servings are in the package? Next, look at the total carbohydrate. It is measured in grams (g).  To find the number of carbohydrate servings in 1 standard serving of Judas Mohammad food, divide the total grams of carbohydrate by 15.  One (1) carbohydrate serving is the amount of food with 15 g carbohydrate. You dont need to count grams of sugars. They are included in the total carbohydrate. The label shows how many calories are in the standard serving. It also lists the amount of fat, cholesterol, sodium, protein, and some vitamins and minerals.  Look below the line listing total fat to find out how much of that fat is saturated fat or trans fat.  Choose foods that are low in these kinds of fats because they are not healthy for your heart.  In the foods that are healthiest for your heart, grams of  saturated fat and trans fat are less than one-third of the total fat grams. If foods are very low in calories (less than 20 calories per serving) or carbohydrates (5 g carbohydrate or less per serving), you may not need to count them when you count carbohydrates. Ask your registered dietitian nutritionist or diabetes educator about these free foods

## 2018-12-15 ENCOUNTER — Emergency Department (HOSPITAL_COMMUNITY)
Admission: EM | Admit: 2018-12-15 | Discharge: 2018-12-16 | Disposition: A | Discharge status: Home / Self Care | Payer: Medicare Other | Attending: Emergency Medicine | Admitting: Emergency Medicine

## 2018-12-15 ENCOUNTER — Encounter (HOSPITAL_COMMUNITY): Payer: Self-pay | Admitting: Student

## 2018-12-15 ENCOUNTER — Other Ambulatory Visit: Payer: Self-pay

## 2018-12-15 DIAGNOSIS — I10 Essential (primary) hypertension: Secondary | ICD-10-CM | POA: Diagnosis not present

## 2018-12-15 DIAGNOSIS — E1165 Type 2 diabetes mellitus with hyperglycemia: Secondary | ICD-10-CM | POA: Insufficient documentation

## 2018-12-15 DIAGNOSIS — R739 Hyperglycemia, unspecified: Secondary | ICD-10-CM | POA: Diagnosis present

## 2018-12-15 DIAGNOSIS — R631 Polydipsia: Secondary | ICD-10-CM | POA: Diagnosis not present

## 2018-12-15 DIAGNOSIS — Z79899 Other long term (current) drug therapy: Secondary | ICD-10-CM | POA: Insufficient documentation

## 2018-12-15 DIAGNOSIS — R358 Other polyuria: Secondary | ICD-10-CM | POA: Insufficient documentation

## 2018-12-15 DIAGNOSIS — F17220 Nicotine dependence, chewing tobacco, uncomplicated: Secondary | ICD-10-CM | POA: Insufficient documentation

## 2018-12-15 DIAGNOSIS — Z794 Long term (current) use of insulin: Secondary | ICD-10-CM | POA: Diagnosis not present

## 2018-12-15 DIAGNOSIS — Z8673 Personal history of transient ischemic attack (TIA), and cerebral infarction without residual deficits: Secondary | ICD-10-CM | POA: Insufficient documentation

## 2018-12-15 LAB — CULTURE, BLOOD (ROUTINE X 2)
Culture: NO GROWTH
Culture: NO GROWTH
Special Requests: ADEQUATE
Special Requests: ADEQUATE

## 2018-12-15 LAB — GLUTAMIC ACID DECARBOXYLASE AUTO ABS: Glutamic Acid Decarb Ab: <5 U/mL (ref 0.0–5.0)

## 2018-12-15 MED ORDER — SODIUM CHLORIDE 0.9 % IV BOLUS
1000.0000 mL | Freq: Once | INTRAVENOUS | Status: AC
  Administered 2018-12-16: 01:00:00 1000 mL via INTRAVENOUS

## 2018-12-15 NOTE — ED Provider Notes (Signed)
[facility] DEPT Provider Note   CSN: [id number] Arrival date & time: 12/15/18  2228     History   Chief Complaint Chief Complaint  Patient presents with  . Hyperglycemia    HPI Edward Morrow is a 68 y.o. male with a hx of HTN, hypercholesterolemia, prior stroke, & DM who presents to the ED w/ complaints of hyperglycemia today. Patient states that he was recently discharged after admission for high blood sugars yesterday. This AM he checked his blood sugar & it was in the 260s, he took 5 units of insulin @ that time. States throughout the day he ate some spaghetti & salmon, later tonight sugar was in the 400s. Took another 5 units of insulin @ 18:00. States he cannot get his blood sugar under controlled, so figured he should come in & get this checked out. He is otherwise asymptomatic. No alleviating/aggravating factors. Denies fever, chills, URI sxs, cough, dyspnea, chest pain, N/V/D, abdominal pain, or dysuria. He states he was having polyuria/polydipsia, but this resolved while in the hospital.   Per chart review:  Patient w/ recent hospital admission 07/17-07/21- presented w/ generalized weakness & dizziness @ that time. Denied hx of DM, however had prior elevated A1C on record, ultimately was admitted for DKA/HHS, found to be hypernatremic & have pancreatitis throughout hospital stay. He was discharged home on NPH relion brand - 5 units BID due to cost of long acing insulin as well as goal of keeping regiment simple secondary to patient confusion w/ teaching. Close PCP follow up.      HPI  Past Medical History:  Diagnosis Date  . High cholesterol   . Hypertension   . Stroke [facility])     Patient Active Problem List   Diagnosis Date Noted  . Acute pancreatitis   . DKA (diabetic ketoacidoses) ([place]) 12/10/2018  . AKI (acute kidney injury) ([place]) 12/10/2018  . HTN (hypertension) 12/10/2018  . HLD (hyperlipidemia) 12/10/2018  . Hyperkalemia 12/10/2018   . QT prolongation 12/10/2018  . Hypothermia 12/10/2018  . Lactic acidosis 12/10/2018  . Acute renal failure ([place])   . TIA (transient ischemic attack) 02/12/2014  . Thalamic infarct, right 02/12/2014  . Uncontrolled hypertension 02/12/2014    No past surgical history on file.      Home Medications    Prior to Admission medications   Medication Sig Start Date End Date Taking? Authorizing Provider  amLODipine (NORVASC) 5 MG tablet Take 1 tablet (5 mg total) by mouth daily. 12/14/18 03/14/19  Elodia Florence., MD  aspirin 325 MG tablet Take 1 tablet (325 mg total) by mouth daily. 02/13/14   Regalado, Belkys A, MD  blood glucose meter kit and supplies KIT Dispense based on patient and insurance preference. Use up to four times daily as directed. (FOR ICD-9 250.00, 250.01). 12/14/18   Elodia Florence., MD  Cholecalciferol (VITAMIN D3 PO) Take 2,000 Units by mouth daily.    [provider]  Insulin NPH, Human,, Isophane, (NOVOLIN N FLEXPEN RELION) 100 UNIT/ML Kiwkpen Inject 5 Units into the skin 2 (two) times daily with a meal. 12/14/18 01/13/19  Elodia Florence., MD  Insulin Pen Needle (RELION PEN NEEDLES) 32G X 4 MM MISC Use with relion flex pen for insulin 12/14/18   Elodia Florence., MD  simvastatin (ZOCOR) 10 MG tablet Take 1 tablet (10 mg total) by mouth daily. 12/14/18 03/14/19  Elodia Florence., MD  famotidine (PEPCID) 20 MG tablet Take  1 tablet (20 mg total) by mouth 2 (two) times daily. 06/12/17 12/10/18  Noemi Chapel, MD    Family History Family History  Problem Relation Age of Onset  . Stroke Mother   . Stroke Father     Social History Social History   Tobacco Use  . Smoking status: Never Smoker  . Smokeless tobacco: Current User    Types: Chew  Substance Use Topics  . Alcohol use: No  . Drug use: No     Allergies   Patient has no known allergies.   Review of Systems Review of Systems  Constitutional: Negative for chills and  fever.  HENT: Negative for congestion, ear pain and sore throat.   Respiratory: Negative for cough and shortness of breath.   Cardiovascular: Negative for chest pain.  Gastrointestinal: Negative for abdominal pain, diarrhea, nausea and vomiting.  Endocrine: Positive for polydipsia (resolved @ this time) and polyuria (resolved @ this time).       + for hyperglycemia.   Genitourinary: Negative for dysuria.  Neurological: Negative for syncope.  All other systems reviewed and are negative.  Physical Exam Updated Vital Signs BP 125/73 (BP Location: Left Arm)   Pulse (!) 102   Temp 99.2 F (37.3 C) (Oral)   Resp 18   SpO2 99%   Physical Exam Vitals signs and nursing note reviewed.  Constitutional:      General: He is not in acute distress.    Appearance: He is well-developed. He is not toxic-appearing.  HENT:     Head: Normocephalic and atraumatic.  Eyes:     General:        Right eye: No discharge.        Left eye: No discharge.     Conjunctiva/sclera: Conjunctivae normal.  Neck:     Musculoskeletal: Neck supple.  Cardiovascular:     Rate and Rhythm: Normal rate and regular rhythm.  Pulmonary:     Effort: Pulmonary effort is normal. No respiratory distress.     Breath sounds: Normal breath sounds. No wheezing, rhonchi or rales.  Abdominal:     General: There is no distension.     Palpations: Abdomen is soft.     Tenderness: There is no abdominal tenderness. There is no guarding or rebound.  Skin:    General: Skin is warm and dry.     Findings: No rash.  Neurological:     Mental Status: He is alert.     Comments: Clear speech.   Psychiatric:        Behavior: Behavior normal.    ED Treatments / Results  Labs (all labs ordered are listed, but only abnormal results are displayed) Labs Reviewed  CBC - Abnormal; Notable for the following components:      Result Value   Hemoglobin 11.0 (*)    HCT 38.0 (*)    MCV 73.9 (*)    MCH 21.4 (*)    MCHC 28.9 (*)    All  other components within normal limits  URINALYSIS, ROUTINE W REFLEX MICROSCOPIC - Abnormal; Notable for the following components:   Color, Urine STRAW (*)    Glucose, UA >=500 (*)    Hgb urine dipstick SMALL (*)    Ketones, ur 5 (*)    Leukocytes,Ua LARGE (*)    All other components within normal limits  BASIC METABOLIC PANEL - Abnormal; Notable for the following components:   Glucose, Bld 374 (*)    All other components within normal limits  CBG  MONITORING, ED - Abnormal; Notable for the following components:   Glucose-Capillary 338 (*)    All other components within normal limits  CBG MONITORING, ED - Abnormal; Notable for the following components:   Glucose-Capillary 221 (*)    All other components within normal limits    EKG None  Radiology No results found.  Procedures Procedures (including critical care time)  Medications Ordered in ED Medications - No data to display   Initial Impression / Assessment and Plan / ED Course  I have reviewed the triage vital signs and the nursing notes.  Pertinent labs & imaging results that were available during my care of the patient were reviewed by me and considered in my medical decision making (see chart for details).   Patient presents to the ED w/ complaints of hyperglycemia.  Recent admission for DKA, started on insulin.  Nontoxic appearing, no apparent distress, vitals WNL- initial tachycardia normalized on my exam. Other than his hyperglycemia he is relatively asymptomatic. Plan for fluids, check labs to assess for DKA.   CBC: No leukocytosis. Baseline anemia.  BMP: Hyperglycemia @ 374, no acidosis or anion gap elevation.  UA: Minimal ketones. Large leuks- patient denies urinary sxs, will culture.   Patient does not appear to be in DKA. Following fluids & insulin CBG improved to 221. Will discharge home @ this time. Continue close monitoring & insulin @ home. Call PCP for close follow up, may need insulin regimen adjustment  ultimately. I discussed results, treatment plan, need for follow-up, and return precautions with the patient. Provided opportunity for questions, patient confirmed understanding and is in agreement with plan.   This is a shared visit with supervising physician Dr. Roxanne Mins who has independently evaluated patient & provided guidance in evaluation/management/disposition, in agreement with care   Final Clinical Impressions(s) / ED Diagnoses   Final diagnoses:  Hyperglycemia    ED Discharge Orders    None       Amaryllis Dyke, PA-C 16/10/96 0454    Delora Fuel, MD 09/81/19 [phone]

## 2018-12-15 NOTE — ED Triage Notes (Signed)
Per EMS - Pt was at [facility] yesterday for hyperglycemia and was sent home. Today his CBG was around 200 this morning and now at 531. Pt stats her took 5 units NovaLOG, but CBG has continued to rise.    150 78 105 HR  18 R

## 2018-12-16 LAB — CBC
HCT: 38 % — ABNORMAL LOW (ref 39.0–52.0)
Hemoglobin: 11 g/dL — ABNORMAL LOW (ref 13.0–17.0)
MCH: 21.4 pg — ABNORMAL LOW (ref 26.0–34.0)
MCHC: 28.9 g/dL — ABNORMAL LOW (ref 30.0–36.0)
MCV: 73.9 fL — ABNORMAL LOW (ref 80.0–100.0)
Platelets: 175 10*3/uL (ref 150–400)
RBC: 5.14 MIL/uL (ref 4.22–5.81)
RDW: 14.7 % (ref 11.5–15.5)
WBC: 8.4 10*3/uL (ref 4.0–10.5)
nRBC: 0 % (ref 0.0–0.2)

## 2018-12-16 LAB — URINALYSIS, ROUTINE W REFLEX MICROSCOPIC
Bacteria, UA: NONE SEEN
Bilirubin Urine: NEGATIVE
Glucose, UA: >=500 mg/dL — AB
Ketones, ur: 5 mg/dL — AB
Nitrite: NEGATIVE
Protein, ur: NEGATIVE mg/dL
Specific Gravity, Urine: 1.024 (ref 1.005–1.030)
pH: 6 (ref 5.0–8.0)

## 2018-12-16 LAB — BASIC METABOLIC PANEL
Anion gap: 10 (ref 5–15)
BUN: 22 mg/dL (ref 8–23)
CO2: 26 mmol/L (ref 22–32)
Calcium: 9 mg/dL (ref 8.9–10.3)
Chloride: 100 mmol/L (ref 98–111)
Creatinine, Ser: 1.19 mg/dL (ref 0.61–1.24)
GFR calc Af Amer: >60 mL/min (ref >60)
GFR calc non Af Amer: >60 mL/min (ref >60)
Glucose, Bld: 374 mg/dL — ABNORMAL HIGH (ref 70–99)
Potassium: 4.4 mmol/L (ref 3.5–5.1)
Sodium: 136 mmol/L (ref 135–145)

## 2018-12-16 LAB — CBG MONITORING, ED
Glucose-Capillary: 221 mg/dL — ABNORMAL HIGH (ref 70–99)
Glucose-Capillary: 338 mg/dL — ABNORMAL HIGH (ref 70–99)

## 2018-12-16 MED ORDER — INSULIN ASPART 100 UNIT/ML ~~LOC~~ SOLN
5.0000 [IU] | Freq: Once | SUBCUTANEOUS | Status: AC
  Administered 2018-12-16: 5 [IU] via SUBCUTANEOUS
  Filled 2018-12-16: qty 0.05

## 2018-12-16 NOTE — Discharge Instructions (Addendum)
You were seen in the ER today for high blood sugar. Your blood sugar improved to 221 with fluids and insulin in the ER. Please follow diabetic diet guidelines, continue to monitor your blood sugar closely, & take your insulin as prescribed.   Call your primary care provider tomorrow morning for close follow up within 1-2 days for recheck of your labs & to discuss your medicines. Return to the ER for new or worsening symptoms or any other concerns.

## 2018-12-17 LAB — URINE CULTURE: Culture: 10000 — AB

## 2018-12-20 LAB — INSULIN ANTIBODIES, BLOOD: Insulin Antibodies, Human: <5 uU/mL

## 2019-09-01 ENCOUNTER — Ambulatory Visit: Payer: Medicare Other | Attending: Family

## 2019-09-01 DIAGNOSIS — Z23 Encounter for immunization: Secondary | ICD-10-CM

## 2019-09-01 NOTE — Progress Notes (Signed)
   Covid-19 Vaccination Clinic  Name:  Edward Morrow    MRN: [id number] DOB: 09-06-1950  09/01/2019  Mr. [name] was observed post Covid-19 immunization for 15 minutes without incident. He was provided with Vaccine Information Sheet and instruction to access the V-Safe system.   Mr. [name] was instructed to call 911 with any severe reactions post vaccine: Marland Kitchen Difficulty breathing  . Swelling of face and throat  . A fast heartbeat  . A bad rash all over body  . Dizziness and weakness   Immunizations Administered    Name Date Dose VIS Date Route   Moderna COVID-19 Vaccine 09/01/2019  1:30 PM 0.5 mL 04/26/2019 Intramuscular   Manufacturer: Moderna   Lot: [id number]   NDC: 90940-005-05

## 2019-10-04 ENCOUNTER — Ambulatory Visit: Payer: Medicare Other | Attending: Family

## 2019-10-04 DIAGNOSIS — Z23 Encounter for immunization: Secondary | ICD-10-CM

## 2019-10-04 NOTE — Progress Notes (Signed)
   Covid-19 Vaccination Clinic  Name:  Edward Morrow    MRN: [id number] DOB: [birth year]  10/04/2019  [name] was observed post Covid-19 immunization for 15 minutes without incident. He was provided with Vaccine Information Sheet and instruction to access the V-Safe system.   [name] was instructed to call 911 with any severe reactions post vaccine: Marland Kitchen Difficulty breathing  . Swelling of face and throat  . A fast heartbeat  . A bad rash all over body  . Dizziness and weakness   Immunizations Administered    Name Date Dose VIS Date Route   Moderna COVID-19 Vaccine 10/04/2019 10:35 AM 0.5 mL 04/2019 Intramuscular   Manufacturer: Moderna   Lot: [id number]   NDC: 62831-517-61

## 2019-10-27 ENCOUNTER — Encounter (HOSPITAL_COMMUNITY): Payer: Self-pay

## 2019-10-27 ENCOUNTER — Other Ambulatory Visit: Payer: Self-pay

## 2019-10-27 ENCOUNTER — Ambulatory Visit (HOSPITAL_COMMUNITY)
Admission: EM | Admit: 2019-10-27 | Discharge: 2019-10-27 | Disposition: A | Discharge status: Home / Self Care | Payer: Medicare Other | Attending: Internal Medicine | Admitting: Internal Medicine

## 2019-10-27 DIAGNOSIS — S161XXA Strain of muscle, fascia and tendon at neck level, initial encounter: Secondary | ICD-10-CM | POA: Diagnosis not present

## 2019-10-27 HISTORY — DX: Type 2 diabetes mellitus without complications: E11.9

## 2019-10-27 MED ORDER — ACETAMINOPHEN 500 MG PO TABS: 500.0000 mg | ORAL_TABLET | Freq: Four times a day (QID) | ORAL | 0 refills | Status: DC | PRN

## 2019-10-27 NOTE — ED Triage Notes (Signed)
Pt c/o acute onset left neck pain/stiffness for approx 3 days; states pain/stiffness worsens with turning head.  Denies trauma or injury, dizziness, CP, or SOB.

## 2019-10-27 NOTE — Discharge Instructions (Signed)
Please take extra strength Tylenol as needed Gentle range of motion exercise Return to urgent care if pain worsens Apply heating pad as needed

## 2019-10-28 NOTE — ED Provider Notes (Signed)
[facility]    CSN: [id number] Arrival date & time: 10/27/19  1421      History   Chief Complaint Chief Complaint  Patient presents with  . Neck Pain    HPI Edward Morrow is a 69 y.o. male comes to urgent care with complaints of acute onset left-sided back pain which started 2 days ago.  Patient describes the pain as sharp, mild to moderate severity aggravated by standing and no known relieving factors.  Patient denies any numbness or tingling in the upper extremities.  No trauma to the neck.  Patient believes that he may have slept wrong. HPI  Past Medical History:  Diagnosis Date  . Diabetes mellitus without complication ([place])   . High cholesterol   . Hypertension   . Stroke [facility])     Patient Active Problem List   Diagnosis Date Noted  . Acute pancreatitis   . DKA (diabetic ketoacidoses) ([place]) 12/10/2018  . AKI (acute kidney injury) ([place]) 12/10/2018  . HTN (hypertension) 12/10/2018  . HLD (hyperlipidemia) 12/10/2018  . Hyperkalemia 12/10/2018  . QT prolongation 12/10/2018  . Hypothermia 12/10/2018  . Lactic acidosis 12/10/2018  . Acute renal failure ([place])   . TIA (transient ischemic attack) 02/12/2014  . Thalamic infarct, right 02/12/2014  . Uncontrolled hypertension 02/12/2014    No past surgical history on file.     Home Medications    Prior to Admission medications   Medication Sig Start Date End Date Taking? Authorizing Provider  amLODipine (NORVASC) 2.5 MG tablet Take 2.5 mg by mouth daily. 09/30/19  Yes [provider]  aspirin 325 MG tablet Take 1 tablet (325 mg total) by mouth daily. 02/13/14  Yes Regalado, Belkys A, MD  Cholecalciferol (VITAMIN D3 PO) Take 2,000 Units by mouth daily.   Yes [provider]  metFORMIN (GLUCOPHAGE-XR) 500 MG 24 hr tablet Take 1,000 mg by mouth at bedtime. 09/30/19  Yes [provider]  acetaminophen (TYLENOL) 500 MG tablet Take 1 tablet (500 mg total) by mouth every 6 (six) hours as  needed. 10/27/19   Ryer Asato, Myrene Galas, MD  amLODipine (NORVASC) 5 MG tablet Take 1 tablet (5 mg total) by mouth daily. 12/14/18 03/14/19  Elodia Florence., MD  blood glucose meter kit and supplies KIT Dispense based on patient and insurance preference. Use up to four times daily as directed. (FOR ICD-9 250.00, 250.01). 12/14/18   Elodia Florence., MD  simvastatin (ZOCOR) 10 MG tablet Take 1 tablet (10 mg total) by mouth daily. 12/14/18 03/14/19  Elodia Florence., MD  famotidine (PEPCID) 20 MG tablet Take 1 tablet (20 mg total) by mouth 2 (two) times daily. 06/12/17 12/10/18  Noemi Chapel, MD  Insulin NPH, Human,, Isophane, (NOVOLIN N FLEXPEN RELION) 100 UNIT/ML Kiwkpen Inject 5 Units into the skin 2 (two) times daily with a meal. 12/14/18 10/27/19  Elodia Florence., MD    Family History Family History  Problem Relation Age of Onset  . Stroke Mother   . Stroke Father     Social History Social History   Tobacco Use  . Smoking status: Never Smoker  . Smokeless tobacco: Current User    Types: Chew  Substance Use Topics  . Alcohol use: No  . Drug use: No     Allergies   Patient has no known allergies.   Review of Systems Review of Systems  Constitutional: Negative.   Respiratory: Negative.   Gastrointestinal: Negative.   Musculoskeletal: Positive  for neck pain. Negative for arthralgias and neck stiffness.  Skin: Negative.   Neurological: Negative for dizziness, light-headedness and headaches.     Physical Exam Triage Vital Signs ED Triage Vitals  Enc Vitals Group     BP 10/27/19 1447 112/75     Pulse Rate 10/27/19 1447 88     Resp 10/27/19 1447 17     Temp 10/27/19 1447 98.6 F (37 C)     Temp Source 10/27/19 1447 Oral     SpO2 10/27/19 1447 98 %     Weight --      Height --      Head Circumference --      Peak Flow --      Pain Score 10/27/19 1448 8     Pain Loc --      Pain Edu? --      Excl. in [place]? --    No data found.  Updated Vital  Signs BP 112/75 (BP Location: Right Arm)   Pulse 88   Temp 98.6 F (37 C) (Oral)   Resp 17   SpO2 98%   Visual Acuity Right Eye Distance:   Left Eye Distance:   Bilateral Distance:    Right Eye Near:   Left Eye Near:    Bilateral Near:     Physical Exam Vitals and nursing note reviewed.  Constitutional:      General: He is not in acute distress.    Appearance: He is not ill-appearing.  Cardiovascular:     Rate and Rhythm: Normal rate and regular rhythm.     Pulses: Normal pulses.     Heart sounds: Normal heart sounds.  Pulmonary:     Effort: Pulmonary effort is normal.     Breath sounds: Normal breath sounds.  Musculoskeletal:     Comments: Tenderness on palpation on the left latissimus dorsi.  Skin:    General: Skin is warm.     Capillary Refill: Capillary refill takes less than 2 seconds.  Neurological:     General: No focal deficit present.     Mental Status: He is alert and oriented to person, place, and time.      UC Treatments / Results  Labs (all labs ordered are listed, but only abnormal results are displayed) Labs Reviewed - No data to display  EKG   Radiology No results found.  Procedures Procedures (including critical care time)  Medications Ordered in UC Medications - No data to display  Initial Impression / Assessment and Plan / UC Course  I have reviewed the triage vital signs and the nursing notes.  Pertinent labs & imaging results that were available during my care of the patient were reviewed by me and considered in my medical decision making (see chart for details).     1.  Acute strain of neck muscles: Tylenol every 6 hours as needed pain Gentle range of motion exercises Apply heat therapy You may want to consider changing your pillows Return precautions given. Final Clinical Impressions(s) / UC Diagnoses   Final diagnoses:  Acute strain of neck muscle, initial encounter     Discharge Instructions     Please take extra  strength Tylenol as needed Gentle range of motion exercise Return to urgent care if pain worsens Apply heating pad as needed   ED Prescriptions    Medication Sig Dispense Auth. Provider   acetaminophen (TYLENOL) 500 MG tablet Take 1 tablet (500 mg total) by mouth every 6 (six) hours as needed.  30 tablet Starkisha Tullis, Myrene Galas, MD     PDMP not reviewed this encounter.   Chase Picket, MD 10/28/19 [phone]

## 2020-03-20 ENCOUNTER — Ambulatory Visit: Payer: Medicare Other | Attending: Internal Medicine

## 2020-04-05 ENCOUNTER — Ambulatory Visit: Payer: Medicare Other | Attending: Internal Medicine

## 2020-04-05 DIAGNOSIS — Z23 Encounter for immunization: Secondary | ICD-10-CM

## 2020-04-05 NOTE — Progress Notes (Signed)
   Covid-19 Vaccination Clinic  Name:  Edward Morrow    MRN: [id number] DOB: December 31, 1950  04/05/2020  [name] was observed post Covid-19 immunization for 15 minutes without incident. He was provided with Vaccine Information Sheet and instruction to access the V-Safe system.   [name] was instructed to call 911 with any severe reactions post vaccine: Marland Kitchen Difficulty breathing  . Swelling of face and throat  . A fast heartbeat  . A bad rash all over body  . Dizziness and weakness

## 2020-06-13 DIAGNOSIS — E119 Type 2 diabetes mellitus without complications: Secondary | ICD-10-CM | POA: Diagnosis not present

## 2020-06-13 DIAGNOSIS — I639 Cerebral infarction, unspecified: Secondary | ICD-10-CM | POA: Diagnosis not present

## 2020-06-13 DIAGNOSIS — E1149 Type 2 diabetes mellitus with other diabetic neurological complication: Secondary | ICD-10-CM | POA: Diagnosis not present

## 2020-06-13 DIAGNOSIS — E785 Hyperlipidemia, unspecified: Secondary | ICD-10-CM | POA: Diagnosis not present

## 2020-06-13 DIAGNOSIS — I10 Essential (primary) hypertension: Secondary | ICD-10-CM | POA: Diagnosis not present

## 2020-06-13 DIAGNOSIS — D649 Anemia, unspecified: Secondary | ICD-10-CM | POA: Diagnosis not present

## 2020-07-10 DIAGNOSIS — E119 Type 2 diabetes mellitus without complications: Secondary | ICD-10-CM | POA: Diagnosis not present

## 2020-07-10 DIAGNOSIS — D649 Anemia, unspecified: Secondary | ICD-10-CM | POA: Diagnosis not present

## 2020-07-10 DIAGNOSIS — I10 Essential (primary) hypertension: Secondary | ICD-10-CM | POA: Diagnosis not present

## 2020-07-10 DIAGNOSIS — I639 Cerebral infarction, unspecified: Secondary | ICD-10-CM | POA: Diagnosis not present

## 2020-07-10 DIAGNOSIS — E785 Hyperlipidemia, unspecified: Secondary | ICD-10-CM | POA: Diagnosis not present

## 2020-07-10 DIAGNOSIS — E1149 Type 2 diabetes mellitus with other diabetic neurological complication: Secondary | ICD-10-CM | POA: Diagnosis not present

## 2020-08-20 DIAGNOSIS — E1149 Type 2 diabetes mellitus with other diabetic neurological complication: Secondary | ICD-10-CM | POA: Diagnosis not present

## 2020-08-20 DIAGNOSIS — I10 Essential (primary) hypertension: Secondary | ICD-10-CM | POA: Diagnosis not present

## 2020-08-20 DIAGNOSIS — I639 Cerebral infarction, unspecified: Secondary | ICD-10-CM | POA: Diagnosis not present

## 2020-08-20 DIAGNOSIS — E119 Type 2 diabetes mellitus without complications: Secondary | ICD-10-CM | POA: Diagnosis not present

## 2020-08-20 DIAGNOSIS — D649 Anemia, unspecified: Secondary | ICD-10-CM | POA: Diagnosis not present

## 2020-08-20 DIAGNOSIS — E785 Hyperlipidemia, unspecified: Secondary | ICD-10-CM | POA: Diagnosis not present

## 2020-09-06 DIAGNOSIS — E119 Type 2 diabetes mellitus without complications: Secondary | ICD-10-CM | POA: Diagnosis not present

## 2020-09-06 DIAGNOSIS — E785 Hyperlipidemia, unspecified: Secondary | ICD-10-CM | POA: Diagnosis not present

## 2020-09-06 DIAGNOSIS — D649 Anemia, unspecified: Secondary | ICD-10-CM | POA: Diagnosis not present

## 2020-09-06 DIAGNOSIS — E1149 Type 2 diabetes mellitus with other diabetic neurological complication: Secondary | ICD-10-CM | POA: Diagnosis not present

## 2020-09-06 DIAGNOSIS — I10 Essential (primary) hypertension: Secondary | ICD-10-CM | POA: Diagnosis not present

## 2020-09-06 DIAGNOSIS — I639 Cerebral infarction, unspecified: Secondary | ICD-10-CM | POA: Diagnosis not present

## 2020-10-19 DIAGNOSIS — E785 Hyperlipidemia, unspecified: Secondary | ICD-10-CM | POA: Diagnosis not present

## 2020-10-19 DIAGNOSIS — I10 Essential (primary) hypertension: Secondary | ICD-10-CM | POA: Diagnosis not present

## 2020-10-19 DIAGNOSIS — D649 Anemia, unspecified: Secondary | ICD-10-CM | POA: Diagnosis not present

## 2020-10-19 DIAGNOSIS — E119 Type 2 diabetes mellitus without complications: Secondary | ICD-10-CM | POA: Diagnosis not present

## 2020-10-19 DIAGNOSIS — I639 Cerebral infarction, unspecified: Secondary | ICD-10-CM | POA: Diagnosis not present

## 2020-10-19 DIAGNOSIS — E1149 Type 2 diabetes mellitus with other diabetic neurological complication: Secondary | ICD-10-CM | POA: Diagnosis not present

## 2020-11-07 DIAGNOSIS — D649 Anemia, unspecified: Secondary | ICD-10-CM | POA: Diagnosis not present

## 2020-11-07 DIAGNOSIS — I10 Essential (primary) hypertension: Secondary | ICD-10-CM | POA: Diagnosis not present

## 2020-11-07 DIAGNOSIS — Z1211 Encounter for screening for malignant neoplasm of colon: Secondary | ICD-10-CM | POA: Diagnosis not present

## 2020-11-07 DIAGNOSIS — E1149 Type 2 diabetes mellitus with other diabetic neurological complication: Secondary | ICD-10-CM | POA: Diagnosis not present

## 2020-11-07 DIAGNOSIS — Z0000 Encounter for general adult medical examination without abnormal findings: Secondary | ICD-10-CM | POA: Diagnosis not present

## 2020-11-07 DIAGNOSIS — E785 Hyperlipidemia, unspecified: Secondary | ICD-10-CM | POA: Diagnosis not present

## 2020-11-07 DIAGNOSIS — Z7984 Long term (current) use of oral hypoglycemic drugs: Secondary | ICD-10-CM | POA: Diagnosis not present

## 2020-11-07 DIAGNOSIS — Z1389 Encounter for screening for other disorder: Secondary | ICD-10-CM | POA: Diagnosis not present

## 2020-11-07 DIAGNOSIS — E1165 Type 2 diabetes mellitus with hyperglycemia: Secondary | ICD-10-CM | POA: Diagnosis not present

## 2020-11-07 DIAGNOSIS — E559 Vitamin D deficiency, unspecified: Secondary | ICD-10-CM | POA: Diagnosis not present

## 2020-11-07 DIAGNOSIS — R202 Paresthesia of skin: Secondary | ICD-10-CM | POA: Diagnosis not present

## 2020-11-23 ENCOUNTER — Other Ambulatory Visit: Payer: Self-pay

## 2020-11-23 ENCOUNTER — Encounter (HOSPITAL_COMMUNITY): Payer: Self-pay

## 2020-11-23 ENCOUNTER — Emergency Department (HOSPITAL_COMMUNITY)
Admission: EM | Admit: 2020-11-23 | Discharge: 2020-11-23 | Disposition: A | Discharge status: Home / Self Care | Payer: Medicare Other | Attending: Emergency Medicine | Admitting: Emergency Medicine

## 2020-11-23 ENCOUNTER — Emergency Department (HOSPITAL_COMMUNITY): Payer: Medicare Other

## 2020-11-23 DIAGNOSIS — R231 Pallor: Secondary | ICD-10-CM | POA: Diagnosis not present

## 2020-11-23 DIAGNOSIS — E1110 Type 2 diabetes mellitus with ketoacidosis without coma: Secondary | ICD-10-CM | POA: Diagnosis not present

## 2020-11-23 DIAGNOSIS — I4510 Unspecified right bundle-branch block: Secondary | ICD-10-CM | POA: Diagnosis not present

## 2020-11-23 DIAGNOSIS — R531 Weakness: Secondary | ICD-10-CM | POA: Insufficient documentation

## 2020-11-23 DIAGNOSIS — F17220 Nicotine dependence, chewing tobacco, uncomplicated: Secondary | ICD-10-CM | POA: Diagnosis not present

## 2020-11-23 DIAGNOSIS — Z79899 Other long term (current) drug therapy: Secondary | ICD-10-CM | POA: Diagnosis not present

## 2020-11-23 DIAGNOSIS — I10 Essential (primary) hypertension: Secondary | ICD-10-CM | POA: Diagnosis not present

## 2020-11-23 DIAGNOSIS — Z7982 Long term (current) use of aspirin: Secondary | ICD-10-CM | POA: Insufficient documentation

## 2020-11-23 DIAGNOSIS — Z7984 Long term (current) use of oral hypoglycemic drugs: Secondary | ICD-10-CM | POA: Insufficient documentation

## 2020-11-23 DIAGNOSIS — R55 Syncope and collapse: Secondary | ICD-10-CM | POA: Diagnosis not present

## 2020-11-23 DIAGNOSIS — Z743 Need for continuous supervision: Secondary | ICD-10-CM | POA: Diagnosis not present

## 2020-11-23 DIAGNOSIS — R404 Transient alteration of awareness: Secondary | ICD-10-CM | POA: Diagnosis not present

## 2020-11-23 LAB — COMPREHENSIVE METABOLIC PANEL
ALT: 10 U/L (ref 0–44)
AST: 21 U/L (ref 15–41)
Albumin: 3.7 g/dL (ref 3.5–5.0)
Alkaline Phosphatase: 83 U/L (ref 38–126)
Anion gap: 10 (ref 5–15)
BUN: 13 mg/dL (ref 8–23)
CO2: 25 mmol/L (ref 22–32)
Calcium: 9.1 mg/dL (ref 8.9–10.3)
Chloride: 102 mmol/L (ref 98–111)
Creatinine, Ser: 1.19 mg/dL (ref 0.61–1.24)
GFR, Estimated: >60 mL/min (ref >60)
Glucose, Bld: 113 mg/dL — ABNORMAL HIGH (ref 70–99)
Potassium: 3.1 mmol/L — ABNORMAL LOW (ref 3.5–5.1)
Sodium: 137 mmol/L (ref 135–145)
Total Bilirubin: 0.5 mg/dL (ref 0.3–1.2)
Total Protein: 7.1 g/dL (ref 6.5–8.1)

## 2020-11-23 LAB — CBC WITH DIFFERENTIAL/PLATELET
Abs Immature Granulocytes: 0.02 10*3/uL (ref 0.00–0.07)
Basophils Absolute: 0 10*3/uL (ref 0.0–0.1)
Basophils Relative: 0 %
Eosinophils Absolute: 0.2 10*3/uL (ref 0.0–0.5)
Eosinophils Relative: 2 %
HCT: 39.3 % (ref 39.0–52.0)
Hemoglobin: 12.2 g/dL — ABNORMAL LOW (ref 13.0–17.0)
Immature Granulocytes: 0 %
Lymphocytes Relative: 27 %
Lymphs Abs: 2.1 10*3/uL (ref 0.7–4.0)
MCH: 22.4 pg — ABNORMAL LOW (ref 26.0–34.0)
MCHC: 31 g/dL (ref 30.0–36.0)
MCV: 72.2 fL — ABNORMAL LOW (ref 80.0–100.0)
Monocytes Absolute: 0.9 10*3/uL (ref 0.1–1.0)
Monocytes Relative: 11 %
Neutro Abs: 4.7 10*3/uL (ref 1.7–7.7)
Neutrophils Relative %: 60 %
Platelets: 239 10*3/uL (ref 150–400)
RBC: 5.44 MIL/uL (ref 4.22–5.81)
RDW: 15.1 % (ref 11.5–15.5)
WBC: 7.9 10*3/uL (ref 4.0–10.5)
nRBC: 0 % (ref 0.0–0.2)

## 2020-11-23 LAB — URINALYSIS, ROUTINE W REFLEX MICROSCOPIC
Bacteria, UA: NONE SEEN
Bilirubin Urine: NEGATIVE
Glucose, UA: NEGATIVE mg/dL
Ketones, ur: NEGATIVE mg/dL
Leukocytes,Ua: NEGATIVE
Nitrite: NEGATIVE
Protein, ur: NEGATIVE mg/dL
Specific Gravity, Urine: 1.008 (ref 1.005–1.030)
pH: 6 (ref 5.0–8.0)

## 2020-11-23 LAB — LIPASE, BLOOD: Lipase: 41 U/L (ref 11–51)

## 2020-11-23 MED ORDER — LACTATED RINGERS IV BOLUS
1000.0000 mL | Freq: Once | INTRAVENOUS | Status: AC
  Administered 2020-11-23: 1000 mL via INTRAVENOUS

## 2020-11-23 NOTE — Discharge Instructions (Addendum)
Please see your doctor if you continue to have night sweats or if you have any new weight loss

## 2020-11-23 NOTE — ED Triage Notes (Signed)
Pt BIBA c/o weakness and near syncope. Per EMS pt was clammy on scene. Pt A&O x4 denies N/V/D. Hx of night sweats x2 months. Pt denies fever and coughs. FSBG on scene 112.

## 2020-11-23 NOTE — ED Notes (Signed)
Patient ambulated without assistance with Jesus, NT

## 2020-11-23 NOTE — ED Provider Notes (Signed)
[facility] EMERGENCY DEPARTMENT Provider Note   CSN: [id number] Arrival date & time: 11/23/20  0213     History Chief Complaint  Patient presents with   Near Syncope    Edward Morrow is a 70 y.o. male.  The history is provided by the patient.  Near Syncope This is a new problem. The problem has been gradually improving. Pertinent negatives include no chest pain, no abdominal pain, no headaches and no shortness of breath. Nothing aggravates the symptoms. Nothing relieves the symptoms.  Patient history of diabetes, hypertension, hyperlipidemia, previous stroke presents with generalized weakness and near syncope Patient reports he was in bed when he woke up feeling clammy and generalized weakness.  He felt like he was going to pass out but did not.  Patient reports has had night sweats for several months.  No fevers or cough.  No new medications.  He reports med compliance.   He reports he has been otherwise at his baseline the previous day.    Past Medical History:  Diagnosis Date   Diabetes mellitus without complication ([employer])    High cholesterol    Hypertension    Stroke [facility])     Patient Active Problem List   Diagnosis Date Noted   Acute pancreatitis    DKA (diabetic ketoacidoses) 12/10/2018   AKI (acute kidney injury) ([place]) 12/10/2018   HTN (hypertension) 12/10/2018   HLD (hyperlipidemia) 12/10/2018   Hyperkalemia 12/10/2018   QT prolongation 12/10/2018   Hypothermia 12/10/2018   Lactic acidosis 12/10/2018   Acute renal failure ([place])    TIA (transient ischemic attack) 02/12/2014   Thalamic infarct, 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 No pertinent surgical history.     Family History  Problem Relation Age of Onset   Stroke Mother    Stroke Father     Social History   Tobacco Use   Smoking status: Never   Smokeless tobacco: Current    Types: Chew  Vaping Use   Vaping Use: Never used   Substance Use Topics   Alcohol use: No   Drug use: No    Home Medications Prior to Admission medications   Medication Sig Start Date End Date Taking? Authorizing Provider  acetaminophen (TYLENOL) 500 MG tablet Take 1 tablet (500 mg total) by mouth every 6 (six) hours as needed. Patient taking differently: Take 500 mg by mouth every 6 (six) hours as needed for moderate pain or headache. 10/27/19  Yes Lamptey, Myrene Galas, MD  amLODipine (NORVASC) 2.5 MG tablet Take 2.5 mg by mouth daily. 09/30/19  Yes [provider]  aspirin 325 MG tablet Take 1 tablet (325 mg total) by mouth daily. 02/13/14  Yes Regalado, Belkys A, MD  blood glucose meter kit and supplies KIT Dispense based on patient and insurance preference. Use up to four times daily as directed. (FOR ICD-9 250.00, 250.01). 12/14/18  Yes Elodia Florence., MD  Cholecalciferol (VITAMIN D3 PO) Take 2,000 Units by mouth daily.   Yes [provider]  metFORMIN (GLUCOPHAGE-XR) 500 MG 24 hr tablet Take 1,000 mg by mouth at bedtime. 09/30/19  Yes [provider]  simvastatin (ZOCOR) 10 MG tablet Take 1 tablet (10 mg total) by mouth daily. 12/14/18 11/23/20 Yes Elodia Florence., MD  amLODipine (NORVASC) 5 MG tablet Take 1 tablet (5 mg total) by mouth daily. Patient not taking: Reported on 11/23/2020 12/14/18 03/14/19  Elodia Florence., MD  famotidine (PEPCID)  20 MG tablet Take 1 tablet (20 mg total) by mouth 2 (two) times daily. 06/12/17 12/10/18  Noemi Chapel, MD  Insulin NPH, Human,, Isophane, (NOVOLIN N FLEXPEN RELION) 100 UNIT/ML Kiwkpen Inject 5 Units into the skin 2 (two) times daily with a meal. 12/14/18 10/27/19  Elodia Florence., MD    Allergies    Patient has no known allergies.  Review of Systems   Review of Systems  Constitutional:  Positive for chills and fatigue. Negative for fever.  Respiratory:  Negative for shortness of breath.   Cardiovascular:  Positive for near-syncope. Negative for chest  pain.  Gastrointestinal:  Negative for abdominal pain.  Genitourinary:  Negative for dysuria.  Neurological:  Positive for light-headedness. Negative for syncope, speech difficulty, numbness and headaches.  All other systems reviewed and are negative.  Physical Exam Updated Vital Signs BP 116/79   Pulse 71   Temp 98.7 F (37.1 C)   Resp 17   Ht 1.676 m (5' 6" )   Wt 59 kg   SpO2 99%   BMI 20.98 kg/m   Physical Exam CONSTITUTIONAL: Well developed/well nourished HEAD: Normocephalic/atraumatic EYES: EOMI/PERRL ENMT: Mucous membranes moist NECK: supple no meningeal signs SPINE/BACK:entire spine nontender CV: S1/S2 noted, no murmurs/rubs/gallops noted LUNGS: Lungs are clear to auscultation bilaterally, no apparent distress ABDOMEN: soft, nontender, no rebound or guarding, bowel sounds noted throughout abdomen GU:no cva tenderness NEURO: Pt is awake/alert/appropriate, moves all extremitiesx4.  No facial droop.  No arm or leg drift.   EXTREMITIES: pulses normal/equal, full ROM SKIN: warm, color normal PSYCH: no abnormalities of mood noted, alert and oriented to situation  ED Results / Procedures / Treatments   Labs (all labs ordered are listed, but only abnormal results are displayed) Labs Reviewed  CBC WITH DIFFERENTIAL/PLATELET - Abnormal; Notable for the following components:      Result Value   Hemoglobin 12.2 (*)    MCV 72.2 (*)    MCH 22.4 (*)    All other components within normal limits  COMPREHENSIVE METABOLIC PANEL - Abnormal; Notable for the following components:   Potassium 3.1 (*)    Glucose, Bld 113 (*)    All other components within normal limits  LIPASE, BLOOD  URINALYSIS, ROUTINE W REFLEX MICROSCOPIC    EKG EKG Interpretation  Date/Time:  Friday November 23 2020 02:17:13 EDT Ventricular Rate:  75 PR Interval:  200 QRS Duration: 162 QT Interval:  422 QTC Calculation: 472 R Axis:   -78 Text Interpretation: Sinus rhythm RBBB and LAFB Confirmed by  Ripley Fraise ([id number]) on 11/23/2020 2:21:04 AM  Radiology DG Chest Port 1 View  Result Date: 11/23/2020 CLINICAL DATA:  Weakness EXAM: PORTABLE CHEST 1 VIEW COMPARISON:  12/09/2018 FINDINGS: The heart size and mediastinal contours are within normal limits. Both lungs are clear. The visualized skeletal structures are unremarkable. IMPRESSION: Negative. Electronically Signed   By: Rolm Baptise M.D.   On: 11/23/2020 03:14    Procedures Procedures   Medications Ordered in ED Medications  lactated ringers bolus 1,000 mL (0 mLs Intravenous Stopped 11/23/20 0400)  lactated ringers bolus 1,000 mL (0 mLs Intravenous Stopped 11/23/20 0529)    ED Course  I have reviewed the triage vital signs and the nursing notes.  Pertinent labs & imaging results that were available during my care of the patient were reviewed by me and considered in my medical decision making (see chart for details).    MDM Rules/Calculators/A&P  4:41 AM Patient presents from home for feeling generalized weakness and near syncope.  Per EMS patient was diaphoretic but admitted to having night sweats for several months.  No other acute symptoms Work-up is pending at this time 6:39 AM Patient stable in the ER.  He ambulated without difficulty. Labs overall reassuring. Patient admits he  has had these episodes before but this was worse than normal. He denies any weight loss.  I advised him that if he has any further night sweats or weight loss he needs to follow with PCP to evaluate for cancer  No dysrhythmias noted on telemetry monitoring. I have low suspicion for occult arrhythmia as cause of his symptoms. Will discharge home.  We discussed return precautions Final Clinical Impression(s) / ED Diagnoses Final diagnoses:  Near syncope    Rx / DC Orders ED Discharge Orders     None        Ripley Fraise, MD 11/23/20 [phone]

## 2020-11-28 DIAGNOSIS — I10 Essential (primary) hypertension: Secondary | ICD-10-CM | POA: Diagnosis not present

## 2020-11-28 DIAGNOSIS — D649 Anemia, unspecified: Secondary | ICD-10-CM | POA: Diagnosis not present

## 2020-11-28 DIAGNOSIS — E119 Type 2 diabetes mellitus without complications: Secondary | ICD-10-CM | POA: Diagnosis not present

## 2020-11-28 DIAGNOSIS — E1149 Type 2 diabetes mellitus with other diabetic neurological complication: Secondary | ICD-10-CM | POA: Diagnosis not present

## 2020-11-28 DIAGNOSIS — E785 Hyperlipidemia, unspecified: Secondary | ICD-10-CM | POA: Diagnosis not present

## 2020-11-28 DIAGNOSIS — I639 Cerebral infarction, unspecified: Secondary | ICD-10-CM | POA: Diagnosis not present

## 2020-12-02 DIAGNOSIS — E119 Type 2 diabetes mellitus without complications: Secondary | ICD-10-CM | POA: Diagnosis not present

## 2020-12-02 DIAGNOSIS — Z0100 Encounter for examination of eyes and vision without abnormal findings: Secondary | ICD-10-CM | POA: Diagnosis not present

## 2021-02-07 DIAGNOSIS — I639 Cerebral infarction, unspecified: Secondary | ICD-10-CM | POA: Diagnosis not present

## 2021-02-07 DIAGNOSIS — E1149 Type 2 diabetes mellitus with other diabetic neurological complication: Secondary | ICD-10-CM | POA: Diagnosis not present

## 2021-02-07 DIAGNOSIS — E785 Hyperlipidemia, unspecified: Secondary | ICD-10-CM | POA: Diagnosis not present

## 2021-02-07 DIAGNOSIS — D649 Anemia, unspecified: Secondary | ICD-10-CM | POA: Diagnosis not present

## 2021-02-07 DIAGNOSIS — E119 Type 2 diabetes mellitus without complications: Secondary | ICD-10-CM | POA: Diagnosis not present

## 2021-02-07 DIAGNOSIS — I10 Essential (primary) hypertension: Secondary | ICD-10-CM | POA: Diagnosis not present

## 2021-02-26 DIAGNOSIS — E1149 Type 2 diabetes mellitus with other diabetic neurological complication: Secondary | ICD-10-CM | POA: Diagnosis not present

## 2021-02-26 DIAGNOSIS — I639 Cerebral infarction, unspecified: Secondary | ICD-10-CM | POA: Diagnosis not present

## 2021-02-26 DIAGNOSIS — D649 Anemia, unspecified: Secondary | ICD-10-CM | POA: Diagnosis not present

## 2021-02-26 DIAGNOSIS — E119 Type 2 diabetes mellitus without complications: Secondary | ICD-10-CM | POA: Diagnosis not present

## 2021-02-26 DIAGNOSIS — I10 Essential (primary) hypertension: Secondary | ICD-10-CM | POA: Diagnosis not present

## 2021-02-26 DIAGNOSIS — E785 Hyperlipidemia, unspecified: Secondary | ICD-10-CM | POA: Diagnosis not present

## 2021-05-24 DIAGNOSIS — E785 Hyperlipidemia, unspecified: Secondary | ICD-10-CM | POA: Diagnosis not present

## 2021-05-24 DIAGNOSIS — D649 Anemia, unspecified: Secondary | ICD-10-CM | POA: Diagnosis not present

## 2021-05-24 DIAGNOSIS — E1149 Type 2 diabetes mellitus with other diabetic neurological complication: Secondary | ICD-10-CM | POA: Diagnosis not present

## 2021-05-24 DIAGNOSIS — I10 Essential (primary) hypertension: Secondary | ICD-10-CM | POA: Diagnosis not present

## 2021-05-24 DIAGNOSIS — E119 Type 2 diabetes mellitus without complications: Secondary | ICD-10-CM | POA: Diagnosis not present

## 2021-05-24 DIAGNOSIS — I639 Cerebral infarction, unspecified: Secondary | ICD-10-CM | POA: Diagnosis not present

## 2021-06-05 DIAGNOSIS — E785 Hyperlipidemia, unspecified: Secondary | ICD-10-CM | POA: Diagnosis not present

## 2021-06-05 DIAGNOSIS — E1165 Type 2 diabetes mellitus with hyperglycemia: Secondary | ICD-10-CM | POA: Diagnosis not present

## 2021-06-05 DIAGNOSIS — E559 Vitamin D deficiency, unspecified: Secondary | ICD-10-CM | POA: Diagnosis not present

## 2021-06-05 DIAGNOSIS — I10 Essential (primary) hypertension: Secondary | ICD-10-CM | POA: Diagnosis not present

## 2021-06-05 DIAGNOSIS — Z7984 Long term (current) use of oral hypoglycemic drugs: Secondary | ICD-10-CM | POA: Diagnosis not present

## 2021-06-05 DIAGNOSIS — R202 Paresthesia of skin: Secondary | ICD-10-CM | POA: Diagnosis not present

## 2021-06-05 DIAGNOSIS — E1149 Type 2 diabetes mellitus with other diabetic neurological complication: Secondary | ICD-10-CM | POA: Diagnosis not present

## 2021-08-02 ENCOUNTER — Ambulatory Visit (HOSPITAL_COMMUNITY)
Admission: EM | Admit: 2021-08-02 | Discharge: 2021-08-02 | Disposition: A | Discharge status: Home / Self Care | Payer: Medicare Other | Attending: Urgent Care | Admitting: Urgent Care

## 2021-08-02 ENCOUNTER — Other Ambulatory Visit: Payer: Self-pay

## 2021-08-02 ENCOUNTER — Encounter (HOSPITAL_COMMUNITY): Payer: Self-pay | Admitting: Emergency Medicine

## 2021-08-02 DIAGNOSIS — R220 Localized swelling, mass and lump, head: Secondary | ICD-10-CM | POA: Diagnosis not present

## 2021-08-02 MED ORDER — FAMOTIDINE 20 MG PO TABS
ORAL_TABLET | ORAL | Status: AC
  Filled 2021-08-02: qty 2

## 2021-08-02 MED ORDER — DEXAMETHASONE SODIUM PHOSPHATE 10 MG/ML IJ SOLN
10.0000 mg | Freq: Once | INTRAMUSCULAR | Status: AC
  Administered 2021-08-02: 10 mg via INTRAMUSCULAR

## 2021-08-02 MED ORDER — DEXAMETHASONE SODIUM PHOSPHATE 10 MG/ML IJ SOLN
INTRAMUSCULAR | Status: AC
  Filled 2021-08-02: qty 1

## 2021-08-02 MED ORDER — FAMOTIDINE 20 MG PO TABS
40.0000 mg | ORAL_TABLET | Freq: Once | ORAL | Status: AC
  Administered 2021-08-02: 40 mg via ORAL

## 2021-08-02 NOTE — Discharge Instructions (Addendum)
You were given an injection of decadron and a tablet of pepcid. This helps with allergic reactions. ?You can start taking Claritin 10mg  in the morning and benadryl 25mg  at night if your symptoms persist.  ?If any new or worsening swelling occurs, particularly in the tongue or face, head straight to the ER for further workup.  ?

## 2021-08-02 NOTE — ED Provider Notes (Signed)
[facility]    CSN: [id number] Arrival date & time: 08/02/21  1726      History   Chief Complaint Chief Complaint  Patient presents with   Oral Swelling    Lower lip swelling    HPI Edward Morrow is a 71 y.o. male.   Pleasant 71 year old male presents today with acute onset of lower lip swelling.  He states it started around noon today.  He is concerned because he states he had a history of angioedema while taking ACE inhibitor back in 2019.  He was seen in the ER and given a shot of Decadron and oral Pepcid, he is requesting similar treatment.  He states it worked well without any adverse reactions.  He does have a history of diabetes but states his sugar was 100 this morning.  He otherwise denies swelling in his mouth, tongue, and denies dysphagia, drooling or trouble swallowing.  He denies any cough, wheezing, GERD.  He denies any systemic allergic symptoms.  He has not tried any medications for symptoms.  He denies eating any new foods today.  He has not taken an ACE inhibitor since 2019. He admits to poor dentition but denies any pain in his teeth.    Past Medical History:  Diagnosis Date   Diabetes mellitus without complication ([place])    High cholesterol    Hypertension    Stroke [facility])     Patient Active Problem List   Diagnosis Date Noted   Acute pancreatitis    DKA (diabetic ketoacidoses) 12/10/2018   AKI (acute kidney injury) ([place]) 12/10/2018   HTN (hypertension) 12/10/2018   HLD (hyperlipidemia) 12/10/2018   Hyperkalemia 12/10/2018   QT prolongation 12/10/2018   Hypothermia 12/10/2018   Lactic acidosis 12/10/2018   Acute renal failure ([place])    TIA (transient ischemic attack) 02/12/2014   Thalamic infarct, 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 No pertinent surgical history.     Home Medications    Prior to Admission medications   Medication Sig Start Date End Date Taking? Authorizing Provider   aspirin 325 MG tablet Take 1 tablet (325 mg total) by mouth daily. 02/13/14  Yes Regalado, Belkys A, MD  Cholecalciferol (VITAMIN D3 PO) Take 2,000 Units by mouth daily.   Yes [provider]  metFORMIN (GLUCOPHAGE-XR) 500 MG 24 hr tablet Take 1,000 mg by mouth at bedtime. 09/30/19  Yes [provider]  simvastatin (ZOCOR) 10 MG tablet Take 1 tablet (10 mg total) by mouth daily. 12/14/18 08/02/21 Yes Elodia Florence., MD  acetaminophen (TYLENOL) 500 MG tablet Take 1 tablet (500 mg total) by mouth every 6 (six) hours as needed. Patient taking differently: Take 500 mg by mouth every 6 (six) hours as needed for moderate pain or headache. 10/27/19   Chase Picket, MD  amLODipine (NORVASC) 2.5 MG tablet Take 2.5 mg by mouth daily. 09/30/19   [provider]  amLODipine (NORVASC) 5 MG tablet Take 1 tablet (5 mg total) by mouth daily. Patient not taking: Reported on 11/23/2020 12/14/18 03/14/19  Elodia Florence., MD  blood glucose meter kit and supplies KIT Dispense based on patient and insurance preference. Use up to four times daily as directed. (FOR ICD-9 250.00, 250.01). 12/14/18   Elodia Florence., MD  famotidine (PEPCID) 20 MG tablet Take 1 tablet (20 mg total) by mouth 2 (two) times daily. 06/12/17 12/10/18  Noemi Chapel, MD  Insulin NPH, Human,, Isophane, (NOVOLIN  N FLEXPEN RELION) 100 UNIT/ML Kiwkpen Inject 5 Units into the skin 2 (two) times daily with a meal. 12/14/18 10/27/19  Elodia Florence., MD    Family History Family History  Problem Relation Age of Onset   Stroke Mother    Stroke Father     Social History Social History   Tobacco Use   Smoking status: Never   Smokeless tobacco: Current    Types: Chew  Vaping Use   Vaping Use: Never used  Substance Use Topics   Alcohol use: No   Drug use: No     Allergies   Ace inhibitors   Review of Systems Review of Systems  HENT:         Lower lip swelling  All other systems reviewed and  are negative.   Physical Exam Triage Vital Signs ED Triage Vitals  Enc Vitals Group     BP 08/02/21 1743 (!) 128/91     Pulse Rate 08/02/21 1743 86     Resp 08/02/21 1743 16     Temp 08/02/21 1743 98.3 F (36.8 C)     Temp src --      SpO2 08/02/21 1743 100 %     Weight --      Height --      Head Circumference --      Peak Flow --      Pain Score 08/02/21 1742 0     Pain Loc --      Pain Edu? --      Excl. in [place]? --    No data found.  Updated Vital Signs BP (!) 128/91    Pulse 86    Temp 98.3 F (36.8 C)    Resp 16    SpO2 100%   Visual Acuity Right Eye Distance:   Left Eye Distance:   Bilateral Distance:    Right Eye Near:   Left Eye Near:    Bilateral Near:     Physical Exam Vitals and nursing note reviewed.  Constitutional:      General: He is not in acute distress.    Appearance: Normal appearance. He is normal weight. He is not ill-appearing, toxic-appearing or diaphoretic.  HENT:     Head: Normocephalic and atraumatic.     Right Ear: Tympanic membrane, ear canal and external ear normal. There is no impacted cerumen.     Left Ear: Tympanic membrane, ear canal and external ear normal. There is no impacted cerumen.     Nose: Nose normal. No congestion or rhinorrhea.     Mouth/Throat:     Mouth: Mucous membranes are moist.     Pharynx: Oropharynx is clear. No oropharyngeal exudate or posterior oropharyngeal erythema.     Comments: Poor dentition without abscess or swelling. No drooling. No tongue or uvular swelling.  Lower lip heterogenously swollen without evidence of lesion, ulceration, erythema or rash.  Eyes:     General: No scleral icterus.       Right eye: No discharge.        Left eye: No discharge.     Extraocular Movements: Extraocular movements intact.     Conjunctiva/sclera: Conjunctivae normal.     Pupils: Pupils are equal, round, and reactive to light.  Cardiovascular:     Rate and Rhythm: Normal rate and regular rhythm.  Pulmonary:      Effort: Pulmonary effort is normal. No respiratory distress.     Breath sounds: Normal breath sounds. No stridor. No wheezing,  rhonchi or rales.  Chest:     Chest wall: No tenderness.  Musculoskeletal:     Cervical back: Normal range of motion and neck supple. No rigidity or tenderness.  Lymphadenopathy:     Cervical: No cervical adenopathy.  Skin:    General: Skin is warm.     Findings: No erythema or rash.  Neurological:     General: No focal deficit present.     Mental Status: He is alert and oriented to person, place, and time.     Cranial Nerves: No cranial nerve deficit.  Psychiatric:        Mood and Affect: Mood normal.        Behavior: Behavior normal.     UC Treatments / Results  Labs (all labs ordered are listed, but only abnormal results are displayed) Labs Reviewed - No data to display  EKG   Radiology No results found.  Procedures Procedures (including critical care time)  Medications Ordered in UC Medications  dexamethasone (DECADRON) injection 10 mg (10 mg Intramuscular Given 08/02/21 1921)  famotidine (PEPCID) tablet 40 mg (40 mg Oral Given 08/02/21 1920)    Initial Impression / Assessment and Plan / UC Course  I have reviewed the triage vital signs and the nursing notes.  Pertinent labs & imaging results that were available during my care of the patient were reviewed by me and considered in my medical decision making (see chart for details).     Lip swelling - decadron and pepcid given in office. Pt monitored for 20 min s/p administration of medication with moderate improvement noted. Strict ER precautions reviewed with pt who states understanding.   Final Clinical Impressions(s) / UC Diagnoses   Final diagnoses:  Lip swelling     Discharge Instructions      You were given an injection of decadron and a tablet of pepcid. This helps with allergic reactions. You can start taking Claritin 68m in the morning and benadryl 255mat night if your  symptoms persist.  If any new or worsening swelling occurs, particularly in the tongue or face, head straight to the ER for further workup.      ED Prescriptions   None    PDMP not reviewed this encounter.   CrChaney MallingPAUtah3/10/23 [id number]

## 2021-08-02 NOTE — ED Triage Notes (Signed)
PT reports lower lip swelling that started at noon, it has not worsened since onset.  ? ?PT denies itching / swelling in tongue or throat, denies shortness of breath.  ? ?

## 2021-08-03 ENCOUNTER — Ambulatory Visit (HOSPITAL_COMMUNITY): Payer: Medicare Other

## 2021-08-20 DIAGNOSIS — Z1211 Encounter for screening for malignant neoplasm of colon: Secondary | ICD-10-CM | POA: Diagnosis not present

## 2021-08-20 DIAGNOSIS — D509 Iron deficiency anemia, unspecified: Secondary | ICD-10-CM | POA: Diagnosis not present

## 2021-08-20 DIAGNOSIS — I10 Essential (primary) hypertension: Secondary | ICD-10-CM | POA: Diagnosis not present

## 2021-08-23 DIAGNOSIS — I639 Cerebral infarction, unspecified: Secondary | ICD-10-CM | POA: Diagnosis not present

## 2021-08-23 DIAGNOSIS — E1149 Type 2 diabetes mellitus with other diabetic neurological complication: Secondary | ICD-10-CM | POA: Diagnosis not present

## 2021-08-23 DIAGNOSIS — E785 Hyperlipidemia, unspecified: Secondary | ICD-10-CM | POA: Diagnosis not present

## 2021-08-23 DIAGNOSIS — I10 Essential (primary) hypertension: Secondary | ICD-10-CM | POA: Diagnosis not present

## 2021-08-23 DIAGNOSIS — E119 Type 2 diabetes mellitus without complications: Secondary | ICD-10-CM | POA: Diagnosis not present

## 2021-09-20 ENCOUNTER — Encounter (HOSPITAL_COMMUNITY): Payer: Self-pay | Admitting: Emergency Medicine

## 2021-09-20 ENCOUNTER — Ambulatory Visit (HOSPITAL_COMMUNITY)
Admission: EM | Admit: 2021-09-20 | Discharge: 2021-09-20 | Disposition: A | Discharge status: Home / Self Care | Payer: Medicare Other

## 2021-09-20 DIAGNOSIS — R220 Localized swelling, mass and lump, head: Secondary | ICD-10-CM | POA: Diagnosis not present

## 2021-09-20 NOTE — ED Triage Notes (Signed)
Pt reports lower lip swelling since this morning. States this has happened before. Denies SOB, or swelling on tongue.  ?

## 2021-09-20 NOTE — Discharge Instructions (Signed)
Thank you for coming to urgent care today - please return if you have any concerns or if your symptoms worsen or do not improve. ? ?You can try an over the counter allergy medicine (Zyrtec, Claritin) if you feel your lip swelling does not improve or if you notice any itching. ? ?Follow up with your primary care provider regarding your visit. ? ?Please go to the emergency department if you develop any tongue or mouth swelling or trouble breathing.  ?

## 2021-09-20 NOTE — ED Provider Notes (Signed)
?[place] ? ? ? ?CSN: [id number] ?Arrival date & time: 09/20/21  1138 ? ? ?  ? ?History   ?Chief Complaint ?Chief Complaint  ?Patient presents with  ? Oral Swelling  ?  Lip  ? ? ?HPI ?Edward Morrow is a 71 y.o. male.  ?He presents to the clinic today with concern for lower lip swelling. He states this has happened to him 2-3 times total, last episode was one month ago in March 2023. He states his lip started feeling a little tight today and when he looked in the mirror he noticed it was slightly swollen. He reports no pain, numbness or tingling, shortness of breath, or difficulty breathing. He reports no other symptoms but wanted to get the swelling checked out just in case. He does not know if the swelling is associated with any food or allergies. He has not started any new medications. He denies trauma to the lip or recent dental or mouth procedures. He has no redness, rash, or bumps on or around the mouth.  ? ?Past Medical History:  ?Diagnosis Date  ? Diabetes mellitus without complication ([place])   ? High cholesterol   ? Hypertension   ? Stroke [facility])   ? ? ?Patient Active Problem List  ? Diagnosis Date Noted  ? Acute pancreatitis   ? DKA (diabetic ketoacidoses) 12/10/2018  ? AKI (acute kidney injury) ([place]) 12/10/2018  ? HTN (hypertension) 12/10/2018  ? HLD (hyperlipidemia) 12/10/2018  ? Hyperkalemia 12/10/2018  ? QT prolongation 12/10/2018  ? Hypothermia 12/10/2018  ? Lactic acidosis 12/10/2018  ? Acute renal failure ([place])   ? TIA (transient ischemic attack) 02/12/2014  ? Thalamic infarct, right 02/12/2014  ? Uncontrolled hypertension 02/12/2014  ? ? ?History reviewed. No pertinent surgical history. ? ? ? ? ?Home Medications   ? ?Prior to Admission medications   ?Medication Sig Start Date End Date Taking? Authorizing Provider  ?acetaminophen (TYLENOL) 500 MG tablet Take 1 tablet (500 mg total) by mouth every 6 (six) hours as needed. ?Patient taking differently: Take 500 mg by mouth every 6 (six)  hours as needed for moderate pain or headache. 10/27/19   Chase Picket, MD  ?amLODipine (NORVASC) 2.5 MG tablet Take 2.5 mg by mouth daily. 09/30/19   [provider]  ?amLODipine (NORVASC) 5 MG tablet Take 1 tablet (5 mg total) by mouth daily. ?Patient not taking: Reported on 11/23/2020 12/14/18 03/14/19  Elodia Florence., MD  ?aspirin 325 MG tablet Take 1 tablet (325 mg total) by mouth daily. 02/13/14   Regalado, Belkys A, MD  ?blood glucose meter kit and supplies KIT Dispense based on patient and insurance preference. Use up to four times daily as directed. (FOR ICD-9 250.00, 250.01). 12/14/18   Elodia Florence., MD  ?Cholecalciferol (VITAMIN D3 PO) Take 2,000 Units by mouth daily.    [provider]  ?metFORMIN (GLUCOPHAGE-XR) 500 MG 24 hr tablet Take 1,000 mg by mouth at bedtime. 09/30/19   [provider]  ?simvastatin (ZOCOR) 10 MG tablet Take 1 tablet (10 mg total) by mouth daily. 12/14/18 08/02/21  Elodia Florence., MD  ?famotidine (PEPCID) 20 MG tablet Take 1 tablet (20 mg total) by mouth 2 (two) times daily. 06/12/17 12/10/18  Noemi Chapel, MD  ?Insulin NPH, Human,, Isophane, (NOVOLIN N FLEXPEN RELION) 100 UNIT/ML Kiwkpen Inject 5 Units into the skin 2 (two) times daily with a meal. 12/14/18 10/27/19  Elodia Florence., MD  ? ? ?Family History ?  Family History  ?Problem Relation Age of Onset  ? Stroke Mother   ? Stroke Father   ? ? ?Social History ?Social History  ? ?Tobacco Use  ? Smoking status: Never  ? Smokeless tobacco: Current  ?  Types: Chew  ?Vaping Use  ? Vaping Use: Never used  ?Substance Use Topics  ? Alcohol use: No  ? Drug use: No  ? ? ? ?Allergies   ?Ace inhibitors ? ? ?Review of Systems ?Review of Systems  ?HENT:  Negative for dental problem, facial swelling and mouth sores.   ?     Lower lip swelling  ?Respiratory:  Negative for chest tightness and shortness of breath.   ?Cardiovascular:  Negative for chest pain and leg swelling.  ?Skin: Negative.   Negative for rash.  ?All other systems reviewed and are negative. ? ? ?Physical Exam ?Triage Vital Signs ?ED Triage Vitals  ?Enc Vitals Group  ?   BP 09/20/21 1254 (!) 143/72  ?   Pulse Rate 09/20/21 1254 75  ?   Resp 09/20/21 1254 17  ?   Temp 09/20/21 1254 98.5 ?F (36.9 ?C)  ?   Temp Source 09/20/21 1254 Oral  ?   SpO2 09/20/21 1254 99 %  ?   Weight 09/20/21 1255 130 lb 1.1 oz (59 kg)  ?   Height 09/20/21 1255 $RemoveBefor'5\' 6"'virJLrzvJtLA$  (1.676 m)  ?   Head Circumference --   ?   Peak Flow --   ?   Pain Score 09/20/21 1255 0  ?   Pain Loc --   ?   Pain Edu? --   ?   Excl. in [place]? --   ? ?No data found. ? ?Updated Vital Signs ?BP (!) 143/72 (BP Location: Left Arm)   Pulse 75   Temp 98.5 ?F (36.9 ?C) (Oral)   Resp 17   Ht $R'5\' 6"'pT$  (1.676 m)   Wt 130 lb 1.1 oz (59 kg)   SpO2 99%   BMI 20.99 kg/m?  ? ? ?Physical Exam ?Constitutional:   ?   Appearance: Normal appearance.  ?HENT:  ?   Nose: Nose normal.  ?   Mouth/Throat:  ?   Lips: Pink. No lesions.  ?   Dentition: Dental caries present. No dental abscesses.  ?   Tongue: No lesions.  ?   Pharynx: Oropharynx is clear. Uvula midline. No pharyngeal swelling.  ? ?   Comments: Mild lower lip swelling on right half of lip ?Cardiovascular:  ?   Rate and Rhythm: Normal rate and regular rhythm.  ?   Heart sounds: Normal heart sounds.  ?Pulmonary:  ?   Effort: Pulmonary effort is normal. No respiratory distress.  ?   Breath sounds: Normal breath sounds.  ? ? ? ?UC Treatments / Results  ?Labs ?(all labs ordered are listed, but only abnormal results are displayed) ?Labs Reviewed - No data to display ? ?EKG ? ? ?Radiology ?No results found. ? ?Procedures ?Procedures (including critical care time) ? ?Medications Ordered in UC ?Medications - No data to display ? ?Initial Impression / Assessment and Plan / UC Course  ?I have reviewed the triage vital signs and the nursing notes. ? ?Pertinent labs & imaging results that were available during my care of the patient were reviewed by me and considered in my  medical decision making (see chart for details). ? ?Patient presenting with mild lower lip swelling with no other concerns. At this time low concern for angioedema or anaphylaxis as  patient has no difficulty breathing, no swelling of tongue or mouth. Airway is clear and lung sounds are clear. It is unknown what caused patients lip swelling today. Recommended over the counter antihistamine such as Zyrtec or Claritin if lip swelling continues or if any itching begins. Patient agrees with plan and is discharged in stable condition with all questions answered. ? ?Return precautions were discussed and patient understands to return to urgent care or emergency department if any changes or worsening of symptoms. ? ?Final Clinical Impressions(s) / UC Diagnoses  ? ?Final diagnoses:  ?Lip swelling  ? ? ? ?Discharge Instructions   ? ?  ?Thank you for coming to urgent care today - please return if you have any concerns or if your symptoms worsen or do not improve. ? ?You can try an over the counter allergy medicine (Zyrtec, Claritin) if you feel your lip swelling does not improve or if you notice any itching. ? ?Follow up with your primary care provider regarding your visit. ? ?Please go to the emergency department if you develop any tongue or mouth swelling or trouble breathing.  ? ? ?ED Prescriptions   ?None ?  ? ?PDMP not reviewed this encounter. ?  ?Levin Dagostino, Wells Guiles, PA-C ?09/20/21 1423 ? ?

## 2021-11-11 DIAGNOSIS — Z1211 Encounter for screening for malignant neoplasm of colon: Secondary | ICD-10-CM | POA: Diagnosis not present

## 2021-11-11 DIAGNOSIS — Z1212 Encounter for screening for malignant neoplasm of rectum: Secondary | ICD-10-CM | POA: Diagnosis not present

## 2021-11-19 LAB — COLOGUARD: COLOGUARD: NEGATIVE

## 2022-02-04 DIAGNOSIS — R202 Paresthesia of skin: Secondary | ICD-10-CM | POA: Diagnosis not present

## 2022-02-04 DIAGNOSIS — E785 Hyperlipidemia, unspecified: Secondary | ICD-10-CM | POA: Diagnosis not present

## 2022-02-04 DIAGNOSIS — I10 Essential (primary) hypertension: Secondary | ICD-10-CM | POA: Diagnosis not present

## 2022-02-04 DIAGNOSIS — E1149 Type 2 diabetes mellitus with other diabetic neurological complication: Secondary | ICD-10-CM | POA: Diagnosis not present

## 2022-02-04 DIAGNOSIS — D649 Anemia, unspecified: Secondary | ICD-10-CM | POA: Diagnosis not present

## 2022-02-04 DIAGNOSIS — E559 Vitamin D deficiency, unspecified: Secondary | ICD-10-CM | POA: Diagnosis not present

## 2022-03-21 DIAGNOSIS — E1149 Type 2 diabetes mellitus with other diabetic neurological complication: Secondary | ICD-10-CM | POA: Diagnosis not present

## 2022-03-21 DIAGNOSIS — I10 Essential (primary) hypertension: Secondary | ICD-10-CM | POA: Diagnosis not present

## 2022-03-21 DIAGNOSIS — D509 Iron deficiency anemia, unspecified: Secondary | ICD-10-CM | POA: Diagnosis not present

## 2022-06-19 DIAGNOSIS — I10 Essential (primary) hypertension: Secondary | ICD-10-CM | POA: Diagnosis not present

## 2022-06-19 DIAGNOSIS — D649 Anemia, unspecified: Secondary | ICD-10-CM | POA: Diagnosis not present

## 2022-06-19 DIAGNOSIS — I639 Cerebral infarction, unspecified: Secondary | ICD-10-CM | POA: Diagnosis not present

## 2022-06-19 DIAGNOSIS — E1149 Type 2 diabetes mellitus with other diabetic neurological complication: Secondary | ICD-10-CM | POA: Diagnosis not present

## 2022-06-19 DIAGNOSIS — E785 Hyperlipidemia, unspecified: Secondary | ICD-10-CM | POA: Diagnosis not present

## 2022-08-20 DIAGNOSIS — E119 Type 2 diabetes mellitus without complications: Secondary | ICD-10-CM | POA: Diagnosis not present

## 2022-08-20 DIAGNOSIS — I10 Essential (primary) hypertension: Secondary | ICD-10-CM | POA: Diagnosis not present

## 2022-08-20 DIAGNOSIS — D649 Anemia, unspecified: Secondary | ICD-10-CM | POA: Diagnosis not present

## 2022-08-20 DIAGNOSIS — E1149 Type 2 diabetes mellitus with other diabetic neurological complication: Secondary | ICD-10-CM | POA: Diagnosis not present

## 2023-05-08 IMAGING — DX DG CHEST 1V PORT
1 series · 1 of 1 positions shown · non-contrast
Comparison: 12/09/2018

CLINICAL DATA: Weakness

EXAM:
PORTABLE CHEST 1 VIEW

[chest]
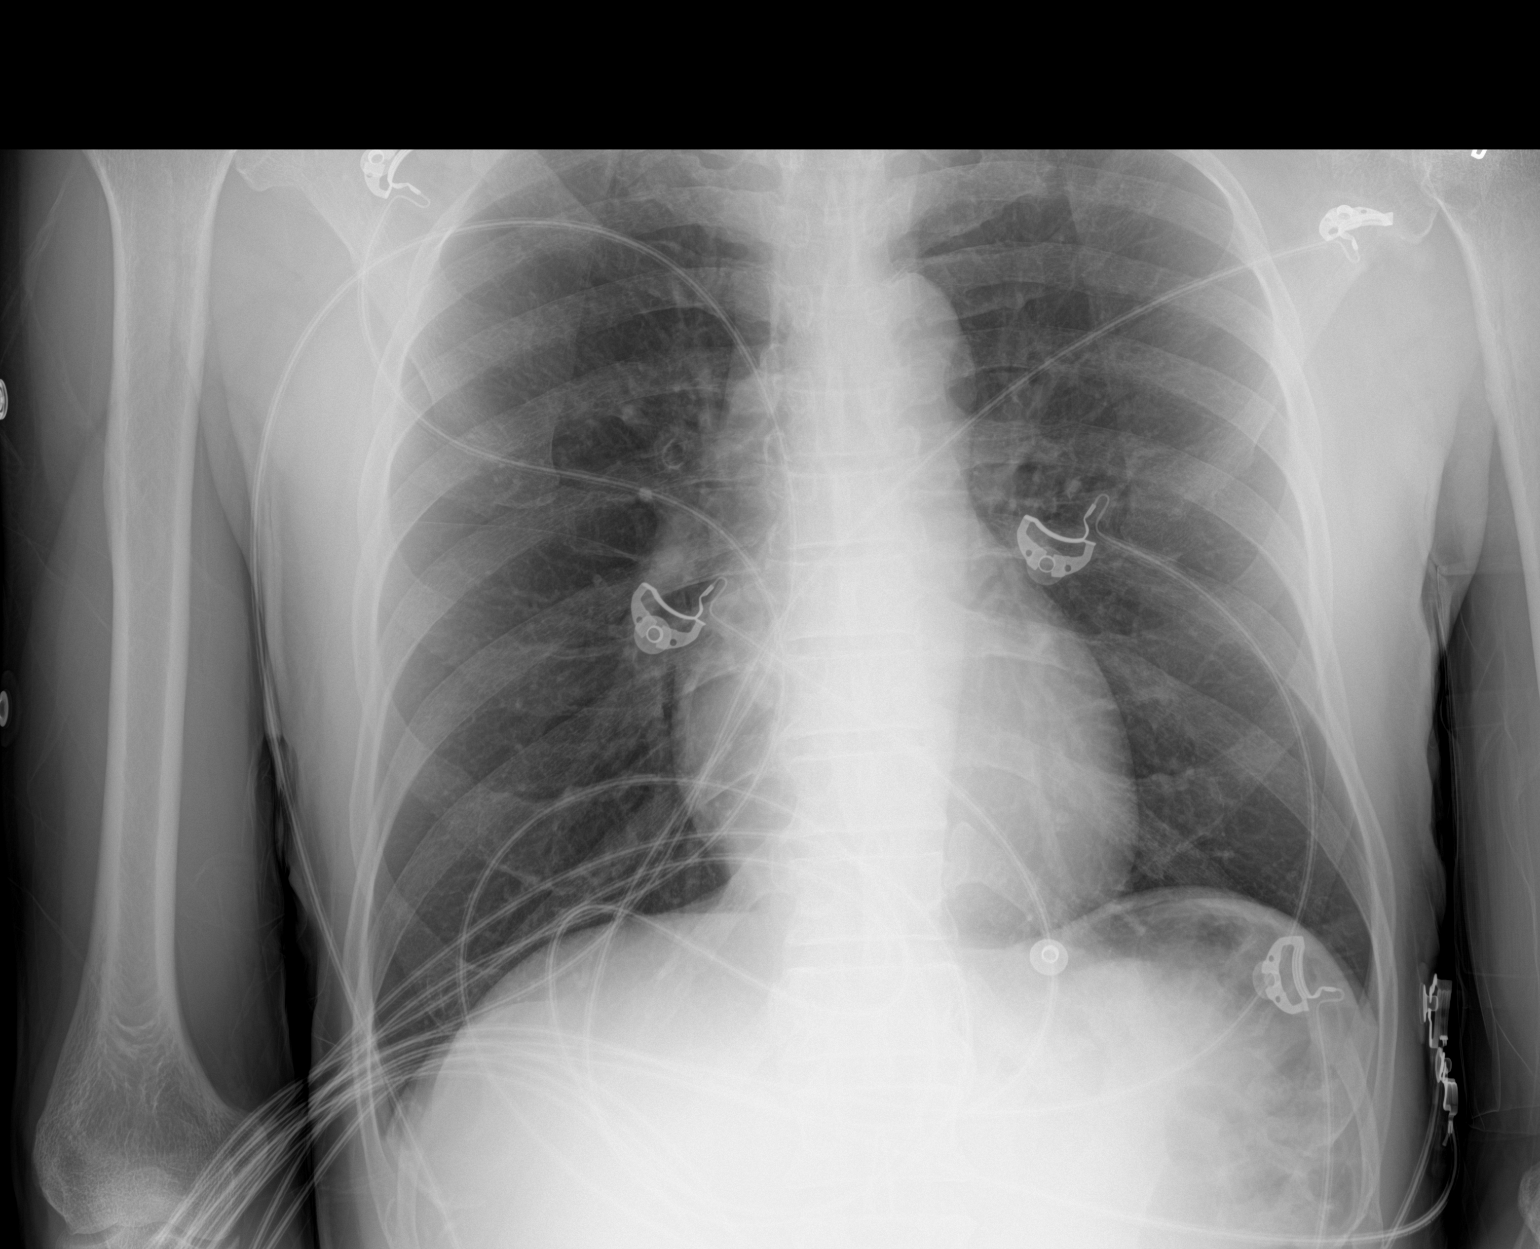

[1 of 1 positions shown; findings below may reference images not displayed]

FINDINGS: The heart size and mediastinal contours are within normal limits.
Both lungs are clear. The visualized skeletal structures are
unremarkable.
IMPRESSION: Negative.

## 2023-08-19 DIAGNOSIS — D649 Anemia, unspecified: Secondary | ICD-10-CM | POA: Diagnosis not present

## 2023-08-19 DIAGNOSIS — E559 Vitamin D deficiency, unspecified: Secondary | ICD-10-CM | POA: Diagnosis not present

## 2023-08-19 DIAGNOSIS — E785 Hyperlipidemia, unspecified: Secondary | ICD-10-CM | POA: Diagnosis not present

## 2023-08-19 DIAGNOSIS — Z0000 Encounter for general adult medical examination without abnormal findings: Secondary | ICD-10-CM | POA: Diagnosis not present

## 2023-08-19 DIAGNOSIS — Z8673 Personal history of transient ischemic attack (TIA), and cerebral infarction without residual deficits: Secondary | ICD-10-CM | POA: Diagnosis not present

## 2023-08-19 DIAGNOSIS — I10 Essential (primary) hypertension: Secondary | ICD-10-CM | POA: Diagnosis not present

## 2023-08-19 DIAGNOSIS — E119 Type 2 diabetes mellitus without complications: Secondary | ICD-10-CM | POA: Diagnosis not present

## 2023-10-16 ENCOUNTER — Encounter (HOSPITAL_COMMUNITY): Payer: Self-pay | Admitting: *Deleted

## 2023-10-16 ENCOUNTER — Ambulatory Visit (HOSPITAL_COMMUNITY)
Admission: EM | Admit: 2023-10-16 | Discharge: 2023-10-16 | Disposition: A | Discharge status: Home / Self Care | Attending: Emergency Medicine | Admitting: Emergency Medicine

## 2023-10-16 ENCOUNTER — Ambulatory Visit (INDEPENDENT_AMBULATORY_CARE_PROVIDER_SITE_OTHER)

## 2023-10-16 DIAGNOSIS — I10 Essential (primary) hypertension: Secondary | ICD-10-CM | POA: Diagnosis not present

## 2023-10-16 DIAGNOSIS — M25512 Pain in left shoulder: Secondary | ICD-10-CM | POA: Diagnosis not present

## 2023-10-16 MED ORDER — LIDOCAINE 5 % EX PTCH: 1.0000 | MEDICATED_PATCH | CUTANEOUS | 0 refills | Status: AC

## 2023-10-16 MED ORDER — ACETAMINOPHEN 500 MG PO TABS: 500.0000 mg | ORAL_TABLET | Freq: Four times a day (QID) | ORAL | 0 refills | Status: AC | PRN

## 2023-10-16 MED ORDER — AMLODIPINE BESYLATE 5 MG PO TABS: 5.0000 mg | ORAL_TABLET | Freq: Every day | ORAL | 0 refills | Status: AC

## 2023-10-16 NOTE — Discharge Instructions (Addendum)
 Your x-ray was negative for any fracture or underlying injury. Take 500 mg Tylenol  every 6 hours as needed for pain. You can also apply lidocaine patches to help with your pain. Alternate between ice and heat as needed for pain. Do some gentle stretching to avoid becoming stiff. I have attached [facility] that you can follow-up with if your pain continues for further evaluation and management.  I have refilled your amlodipine .  Start taking this once daily.  Follow-up with your primary care provider for further management of your blood pressure. Return here as needed.
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
## 2024-02-19 DIAGNOSIS — E1149 Type 2 diabetes mellitus with other diabetic neurological complication: Secondary | ICD-10-CM | POA: Diagnosis not present

## 2024-02-19 DIAGNOSIS — E785 Hyperlipidemia, unspecified: Secondary | ICD-10-CM | POA: Diagnosis not present

## 2024-02-19 DIAGNOSIS — E559 Vitamin D deficiency, unspecified: Secondary | ICD-10-CM | POA: Diagnosis not present

## 2024-02-19 DIAGNOSIS — I639 Cerebral infarction, unspecified: Secondary | ICD-10-CM | POA: Diagnosis not present

## 2024-02-19 DIAGNOSIS — I10 Essential (primary) hypertension: Secondary | ICD-10-CM | POA: Diagnosis not present
